# Patient Record
Sex: Female | Born: 1995 | Race: Asian | Hispanic: No | Marital: Married | Smoking: Never smoker
Health system: Southern US, Community
[De-identification: ages and names within clinical notes are randomized; demographics above are authoritative.]

## PROBLEM LIST (undated history)

## (undated) DIAGNOSIS — E282 Polycystic ovarian syndrome: Secondary | ICD-10-CM

## (undated) DIAGNOSIS — Z789 Other specified health status: Secondary | ICD-10-CM

## (undated) HISTORY — PX: NO PAST SURGERIES: SHX2092

## (undated) HISTORY — DX: Other specified health status: Z78.9

---

## 2019-04-29 ENCOUNTER — Ambulatory Visit (INDEPENDENT_AMBULATORY_CARE_PROVIDER_SITE_OTHER): Payer: Self-pay

## 2019-04-29 ENCOUNTER — Ambulatory Visit (HOSPITAL_COMMUNITY)
Admission: EM | Admit: 2019-04-29 | Discharge: 2019-04-29 | Disposition: A | Discharge status: Home / Self Care | Payer: Self-pay

## 2019-04-29 ENCOUNTER — Other Ambulatory Visit: Payer: Self-pay

## 2019-04-29 ENCOUNTER — Encounter (HOSPITAL_COMMUNITY): Payer: Self-pay | Admitting: Emergency Medicine

## 2019-04-29 DIAGNOSIS — R1084 Generalized abdominal pain: Secondary | ICD-10-CM

## 2019-04-29 DIAGNOSIS — Z3202 Encounter for pregnancy test, result negative: Secondary | ICD-10-CM

## 2019-04-29 DIAGNOSIS — K5900 Constipation, unspecified: Secondary | ICD-10-CM

## 2019-04-29 HISTORY — DX: Polycystic ovarian syndrome: E28.2

## 2019-04-29 LAB — POCT URINALYSIS DIP (DEVICE)
Bilirubin Urine: NEGATIVE
Glucose, UA: NEGATIVE mg/dL
Hgb urine dipstick: NEGATIVE
Ketones, ur: NEGATIVE mg/dL
Leukocytes,Ua: NEGATIVE
Nitrite: NEGATIVE
Protein, ur: NEGATIVE mg/dL
Specific Gravity, Urine: <=1.005 (ref 1.005–1.030)
Urobilinogen, UA: 0.2 mg/dL (ref 0.0–1.0)
pH: 6.5 (ref 5.0–8.0)

## 2019-04-29 LAB — POCT PREGNANCY, URINE: Preg Test, Ur: NEGATIVE

## 2019-04-29 MED ORDER — MAGNESIUM CITRATE PO SOLN: 1.0000 | Freq: Once | ORAL | 0 refills | Status: AC

## 2019-04-29 MED ORDER — DOCUSATE SODIUM 100 MG PO CAPS: 100.0000 mg | ORAL_CAPSULE | Freq: Two times a day (BID) | ORAL | 0 refills | Status: AC

## 2019-04-29 MED ORDER — POLYETHYLENE GLYCOL 3350 17 G PO PACK: 17.0000 g | PACK | Freq: Every day | ORAL | 0 refills | Status: DC

## 2019-04-29 NOTE — ED Notes (Signed)
Instructed to put on a gown for provider exam

## 2019-04-29 NOTE — ED Notes (Signed)
Patient in bathroom in lobby 

## 2019-04-29 NOTE — ED Provider Notes (Signed)
[facility]    CSN: [id number] Arrival date & time: 04/29/19  1004      History   Chief Complaint Chief Complaint  Patient presents with  . Abdominal Pain    HPI Anne Becker is a 23 y.o. female.   Patient presents with generalized abdominal pain x2 days.  She reports no bowel movement x4 days and feels constipated.  She has attempted treatment with stool softeners without relief.  She also reports dysuria and a small amount of white vaginal discharge.  She denies fever, chills, vomiting, diarrhea, back pain, pelvic pain, or other symptoms.  LMP: July 2020, patient states her cycles are irregular due to PCOS.  She is sexually active with 1 partner x3 months without use of condoms.  The history is provided by the patient.    Past Medical History:  Diagnosis Date  . PCOS (polycystic ovarian syndrome)     There are no active problems to display for this patient.   History reviewed. No pertinent surgical history.  OB History   No obstetric history on file.      Home Medications    Prior to Admission medications   Medication Sig Start Date End Date Taking? Authorizing Provider  ferrous sulfate 325 (65 FE) MG tablet Take 325 mg by mouth daily with breakfast.   Yes [provider]  METFORMIN HCL PO Take by mouth.   Yes [provider]  NON FORMULARY supplements   Yes [provider]  docusate sodium (COLACE) 100 MG capsule Take 1 capsule (100 mg total) by mouth every 12 (twelve) hours. 04/30/19   Mickie Bailate, Kynlee Koenigsberg H, NP  magnesium citrate SOLN Take 296 mLs (1 Bottle total) by mouth once for 1 dose. 04/29/19 04/29/19  Mickie Bailate, Shilpa Bushee H, NP  polyethylene glycol (MIRALAX / GLYCOLAX) 17 g packet Take 17 g by mouth daily. 04/30/19   Mickie Bailate, Zenora Karpel H, NP    Family History Family History  Problem Relation Age of Onset  . Diabetes Mother   . Diabetes Father     Social History Social History   Tobacco Use  . Smoking status: Never Smoker  Substance Use  Topics  . Alcohol use: Not Currently  . Drug use: Never     Allergies   Patient has no known allergies.   Review of Systems Review of Systems  Constitutional: Negative for chills and fever.  HENT: Negative for ear pain and sore throat.   Eyes: Negative for pain and visual disturbance.  Respiratory: Negative for cough and shortness of breath.   Cardiovascular: Negative for chest pain and palpitations.  Gastrointestinal: Positive for abdominal distention, abdominal pain and constipation. Negative for vomiting.  Genitourinary: Positive for dysuria and vaginal discharge. Negative for hematuria.  Musculoskeletal: Negative for arthralgias and back pain.  Skin: Negative for color change and rash.  Neurological: Negative for seizures and syncope.  All other systems reviewed and are negative.    Physical Exam Triage Vital Signs ED Triage Vitals  Enc Vitals Group     BP      Pulse      Resp      Temp      Temp src      SpO2      Weight      Height      Head Circumference      Peak Flow      Pain Score      Pain Loc      Pain Edu?  Excl. in [place]?    No data found.  Updated Vital Signs BP 127/86   Pulse (!) 108   Temp 98.6 F (37 C) (Oral)   Resp 16   LMP 01/10/2019   SpO2 100%   Visual Acuity Right Eye Distance:   Left Eye Distance:   Bilateral Distance:    Right Eye Near:   Left Eye Near:    Bilateral Near:     Physical Exam Vitals signs and nursing note reviewed.  Constitutional:      General: She is not in acute distress.    Appearance: She is well-developed.  HENT:     Head: Normocephalic and atraumatic.     Mouth/Throat:     Mouth: Mucous membranes are moist.     Pharynx: Oropharynx is clear.  Eyes:     Conjunctiva/sclera: Conjunctivae normal.  Neck:     Musculoskeletal: Neck supple.  Cardiovascular:     Rate and Rhythm: Normal rate and regular rhythm.     Heart sounds: No murmur.  Pulmonary:     Effort: Pulmonary effort is normal. No  respiratory distress.     Breath sounds: Normal breath sounds.  Abdominal:     General: Bowel sounds are normal. There is distension.     Palpations: Abdomen is soft.     Tenderness: There is abdominal tenderness. There is no right CVA tenderness, left CVA tenderness, guarding or rebound.     Comments: Mild generalized tenderness to palpation.   Skin:    General: Skin is warm and dry.  Neurological:     Mental Status: She is alert.      UC Treatments / Results  Labs (all labs ordered are listed, but only abnormal results are displayed) Labs Reviewed  POCT URINALYSIS DIP (DEVICE)  POCT PREGNANCY, URINE  CERVICOVAGINAL ANCILLARY ONLY    EKG   Radiology Dg Abd 1 View  Result Date: 04/29/2019 CLINICAL DATA:  Abdominal pain and constipation EXAM: ABDOMEN - 1 VIEW COMPARISON:  None. FINDINGS: There is diffuse stool throughout much of the colon. Upper rectum is mildly distended by stool. No bowel dilatation or air-fluid level evident to suggest bowel obstruction. No free air. No abnormal calcifications. IMPRESSION: Findings indicative of a degree of constipation. Note that the upper rectum is mildly distended by stool. No bowel obstruction or free air evident. Electronically Signed   By: Lowella Grip III M.D.   On: 04/29/2019 11:41    Procedures Procedures (including critical care time)  Medications Ordered in UC Medications - No data to display  Initial Impression / Assessment and Plan / UC Course  I have reviewed the triage vital signs and the nursing notes.  Pertinent labs & imaging results that were available during my care of the patient were reviewed by me and considered in my medical decision making (see chart for details).   Generalized abdominal pain, constipation.  Urine pregnancy negative; urine dip negative.  KUB shows constipation but no obstruction or free air.  Patient obtained a self vaginal swab and STD tests pending.  Treating with magnesium citrate today  and then starting MiraLAX and Colace tomorrow.  Strict instructions discussed with patient at length to go to the emergency department if she has acute worsening symptoms including abdominal pain or if she develops new symptoms such as fever or vomiting.  Instructed patient to follow-up with her PCP as needed.  Patient agrees to plan of care.       Final Clinical Impressions(s) / UC  Diagnoses   Final diagnoses:  Generalized abdominal pain  Constipation, unspecified constipation type     Discharge Instructions     Take the magnesium citrate today as directed.    Start taking the polyethylene glycol and docusate as directed tomorrow.    Go to the emergency department if you have acute worsening symptoms, including worsening abdominal pain; or if you develop new symptoms such as fever or vomiting.    Follow-up with your primary care provider or the provider listed below as needed.          ED Prescriptions    Medication Sig Dispense Auth. Provider   magnesium citrate SOLN Take 296 mLs (1 Bottle total) by mouth once for 1 dose. 195 mL Wendee Beavers H, NP   polyethylene glycol (MIRALAX / GLYCOLAX) 17 g packet Take 17 g by mouth daily. 14 each Mickie Bail, NP   docusate sodium (COLACE) 100 MG capsule Take 1 capsule (100 mg total) by mouth every 12 (twelve) hours. 60 capsule Mickie Bail, NP     PDMP not reviewed this encounter.   Mickie Bail, NP 04/29/19 [phone]

## 2019-04-29 NOTE — ED Triage Notes (Signed)
abdominal pain for 2 days.  Patient has taken stool softner with no relief.  Patient reports stool is too hard.  Denies history of constipation.  Last bm was 4 days ago

## 2019-04-29 NOTE — Discharge Instructions (Signed)
Take the magnesium citrate today as directed.    Start taking the polyethylene glycol and docusate as directed tomorrow.    Go to the emergency department if you have acute worsening symptoms, including worsening abdominal pain; or if you develop new symptoms such as fever or vomiting.    Follow-up with your primary care provider or the provider listed below as needed.

## 2019-04-30 ENCOUNTER — Ambulatory Visit: Payer: Self-pay | Admitting: Nurse Practitioner

## 2019-05-02 ENCOUNTER — Telehealth (HOSPITAL_COMMUNITY): Payer: Self-pay | Admitting: Emergency Medicine

## 2019-05-02 LAB — CERVICOVAGINAL ANCILLARY ONLY
Bacterial vaginitis: NEGATIVE
Candida vaginitis: POSITIVE — AB
Chlamydia: NEGATIVE
Neisseria Gonorrhea: NEGATIVE
Trichomonas: NEGATIVE

## 2019-05-02 MED ORDER — FLUCONAZOLE 150 MG PO TABS: 150.0000 mg | ORAL_TABLET | Freq: Once | ORAL | 0 refills | Status: AC

## 2019-05-02 NOTE — Telephone Encounter (Signed)
Test for candida (yeast) was positive.  Prescription for fluconazole 150mg po now, repeat dose in 3d if needed, #2 no refills, sent to the pharmacy of record.  Recheck or followup with PCP for further evaluation if symptoms are not improving.    Patient contacted and made aware of    results, all questions answered   

## 2019-05-27 ENCOUNTER — Encounter: Payer: Self-pay | Admitting: Advanced Practice Midwife

## 2019-05-27 ENCOUNTER — Ambulatory Visit: Payer: Self-pay | Admitting: Clinical

## 2019-05-27 ENCOUNTER — Ambulatory Visit (INDEPENDENT_AMBULATORY_CARE_PROVIDER_SITE_OTHER): Payer: Self-pay | Admitting: Advanced Practice Midwife

## 2019-05-27 ENCOUNTER — Other Ambulatory Visit: Payer: Self-pay

## 2019-05-27 VITALS — BP 125/81 | HR 98 | Wt 118.4 lb

## 2019-05-27 DIAGNOSIS — Z319 Encounter for procreative management, unspecified: Secondary | ICD-10-CM

## 2019-05-27 DIAGNOSIS — Z0000 Encounter for general adult medical examination without abnormal findings: Secondary | ICD-10-CM

## 2019-05-27 DIAGNOSIS — R4589 Other symptoms and signs involving emotional state: Secondary | ICD-10-CM

## 2019-05-27 DIAGNOSIS — Z598 Other problems related to housing and economic circumstances: Secondary | ICD-10-CM

## 2019-05-27 DIAGNOSIS — Z5989 Other problems related to housing and economic circumstances: Secondary | ICD-10-CM

## 2019-05-27 DIAGNOSIS — Z658 Other specified problems related to psychosocial circumstances: Secondary | ICD-10-CM

## 2019-05-27 DIAGNOSIS — E282 Polycystic ovarian syndrome: Secondary | ICD-10-CM

## 2019-05-27 NOTE — BH Specialist Note (Signed)
Integrated Behavioral Health Initial Visit  MRN: [id number] Name: Joliene Salvador  Number of [facility] Clinician visits:: 1/6 Session Start time: 4:52  Session End time: 5:07 Total time: 15  Type of Service: [place] Interpretor:Yes.   Interpretor Name and Language: Bengali   Warm Hand Off Completed.       SUBJECTIVE: Arbie Reisz is a 23 y.o. female accompanied by n/a Patient was referred by Mallie Snooks, CNM  for positive depression screen. Patient reports the following symptoms/concerns: Pt states she feels sad and hopeless, does not think she will be able to have a baby with PCOS and being uninsured.  Duration of problem: Undetermined; Severity of problem: severe  OBJECTIVE: Mood: Anxious and Affect: Appropriate Risk of harm to self or others: No plan to harm self or others  LIFE CONTEXT: Family and Social: - School/Work: - Self-Care: - Life Changes: Recent PCOS diagnosis  GOALS ADDRESSED: Patient will: 1. Reduce symptoms of: stress 2. Increase knowledge and/or ability of: healthy habits and stress reduction  3. Demonstrate ability to: Increase healthy adjustment to current life circumstances  INTERVENTIONS: Interventions utilized: Supportive Counseling  Standardized Assessments completed: GAD-7 and PHQ 9  ASSESSMENT: Patient currently experiencing Psychosocial stress.   Patient may benefit from brief supportive counseling today and financial application.  PLAN: 1. Follow up with behavioral health clinician on : As needed 2. Behavioral recommendations:  -Take financial paperwork home today; husband will help complete and send in to address provided - 3. Referral(s): [facility]) 4. "From scale of 1-10, how likely are you to follow plan?": -  Garlan Fair, LCSW

## 2019-05-27 NOTE — Patient Instructions (Signed)
Polycystic Ovarian Syndrome  Polycystic ovarian syndrome (PCOS) is a common hormonal disorder among women of reproductive age. In most women with PCOS, many small fluid-filled sacs (cysts) grow on the ovaries, and the cysts are not part of a normal menstrual cycle. PCOS can cause problems with your menstrual periods and make it difficult to get pregnant. It can also cause an increased risk of miscarriage with pregnancy. If it is not treated, PCOS can lead to serious health problems, such as diabetes and heart disease. What are the causes? The cause of PCOS is not known, but it may be the result of a combination of certain factors, such as:  Irregular menstrual cycle.  High levels of certain hormones (androgens).  Problems with the hormone that helps to control blood sugar (insulin resistance).  Certain genes. What increases the risk? This condition is more likely to develop in women who have a family history of PCOS. What are the signs or symptoms? Symptoms of PCOS may include:  Multiple ovarian cysts.  Infrequent periods or no periods.  Periods that are too frequent or too heavy.  Unpredictable periods.  Inability to get pregnant (infertility) because of not ovulating.  Increased growth of hair on the face, chest, stomach, back, thumbs, thighs, or toes.  Acne or oily skin. Acne may develop during adulthood, and it may not respond to treatment.  Pelvic pain.  Weight gain or obesity.  Patches of thickened and dark brown or black skin on the neck, arms, breasts, or thighs (acanthosis nigricans).  Excess hair growth on the face, chest, abdomen, or upper thighs (hirsutism). How is this diagnosed? This condition is diagnosed based on:  Your medical history.  A physical exam, including a pelvic exam. Your health care provider may look for areas of increased hair growth on your skin.  Tests, such as: ? Ultrasound. This may be used to examine the ovaries and the lining of the  uterus (endometrium) for cysts. ? Blood tests. These may be used to check levels of sugar (glucose), female hormone (testosterone), and female hormones (estrogen and progesterone) in your blood. How is this treated? There is no cure for PCOS, but treatment can help to manage symptoms and prevent more health problems from developing. Treatment varies depending on:  Your symptoms.  Whether you want to have a baby or whether you need birth control (contraception). Treatment may include nutrition and lifestyle changes along with:  Progesterone hormone to start a menstrual period.  Birth control pills to help you have regular menstrual periods.  Medicines to make you ovulate, if you want to get pregnant.  Medicine to reduce excessive hair growth.  Surgery, in severe cases. This may involve making small holes in one or both of your ovaries. This decreases the amount of testosterone that your body produces. Follow these instructions at home:  Take over-the-counter and prescription medicines only as told by your health care provider.  Follow a healthy meal plan. This can help you reduce the effects of PCOS. ? Eat a healthy diet that includes lean proteins, complex carbohydrates, fresh fruits and vegetables, low-fat dairy products, and healthy fats. Make sure to eat enough fiber.  If you are overweight, lose weight as told by your health care provider. ? Losing 10% of your body weight may improve symptoms. ? Your health care provider can determine how much weight loss is best for you and can help you lose weight safely.  Keep all follow-up visits as told by your health care provider.   This is important. Contact a health care provider if:  Your symptoms do not get better with medicine.  You develop new symptoms. This information is not intended to replace advice given to you by your health care provider. Make sure you discuss any questions you have with your health care provider. Document  Released: 10/06/2004 Document Revised: 05/25/2017 Document Reviewed: 11/28/2015 Elsevier Patient Education  2020 Elsevier Inc.  

## 2019-05-27 NOTE — Progress Notes (Signed)
GYNECOLOGY ANNUAL PREVENTATIVE CARE ENCOUNTER NOTE  History:     Anne Becker is a 23 y.o. G0P0000 female here for a routine annual gynecologic exam.  Current complaints: irregular cycles in setting of previously diagnosed PCOS.   Patient estimates she has a menstrual cycle about every 6 months. She and her husband desire pregnancy. An outside provider started her on Metformin about eight months ago but she only started living with her husband three months ago. She estimates they had intercourse 3-4 times per week for the first two months of their cohabitation but have not had sex in four weeks due to a recurrent yeast infection.  Patient was seen in the ED for abdominal pain on 11/03. She was advised to start Miralax and Colace for constipation and continues to do so. She endorses a bowel movement every other day.   She denies discharge, pelvic pain, problems with intercourse or other gynecologic concerns.    Gynecologic History Patient's last menstrual period was 05/16/2019 (exact date). Contraception: none, desires pregnancy Last Pap: remote. Results were normal per patient  Obstetric History OB History  Gravida Para Term Preterm AB Living  0 0 0 0 0 0  SAB TAB Ectopic Multiple Live Births  0 0 0 0 0    Past Medical History:  Diagnosis Date  . PCOS (polycystic ovarian syndrome)     No past surgical history on file.  Current Outpatient Medications on File Prior to Visit  Medication Sig Dispense Refill  . docusate sodium (COLACE) 100 MG capsule Take 1 capsule (100 mg total) by mouth every 12 (twelve) hours. 60 capsule 0  . ferrous sulfate 325 (65 FE) MG tablet Take 325 mg by mouth daily with breakfast.    . METFORMIN HCL PO Take by mouth.    . NON FORMULARY supplements    . polyethylene glycol (MIRALAX / GLYCOLAX) 17 g packet Take 17 g by mouth daily. 14 each 0  . RIBOFLAVIN PO Take by mouth.     No current facility-administered medications on file prior to visit.      No Known Allergies  Social History:  reports that she has never smoked. She has never used smokeless tobacco. She reports previous alcohol use. She reports that she does not use drugs.  Family History  Problem Relation Age of Onset  . Diabetes Mother   . Diabetes Father     The following portions of the patient's history were reviewed and updated as appropriate: allergies, current medications, past family history, past medical history, past social history, past surgical history and problem list.  Review of Systems Pertinent items noted in HPI and remainder of comprehensive ROS otherwise negative.  Physical Exam:  BP 125/81   Pulse 98   Wt 118 lb 6.4 oz (53.7 kg)   LMP 05/16/2019 (Exact Date)  CONSTITUTIONAL: Well-developed, well-nourished female in no acute distress.  HENT:  Normocephalic, atraumatic, External right and left ear normal. Oropharynx is clear and moist EYES: Conjunctivae and EOM are normal. Pupils are equal, round, and reactive to light. No scleral icterus.  NECK: Normal range of motion, supple, no masses.  Normal thyroid.  SKIN: Skin is warm and dry. No rash noted. Not diaphoretic. No erythema. No pallor. MUSCULOSKELETAL: Normal range of motion. No tenderness.  No cyanosis, clubbing, or edema.  2+ distal pulses. NEUROLOGIC: Alert and oriented to person, place, and time. Normal reflexes, muscle tone coordination.  PSYCHIATRIC: Normal mood and affect. Normal behavior. Normal judgment and thought content.  CARDIOVASCULAR: Normal heart rate noted, regular rhythm RESPIRATORY: Clear to auscultation bilaterally. Effort and breath sounds normal, no problems with respiration noted. BREASTS: Symmetric in size. No masses, tenderness, skin changes, nipple drainage, or lymphadenopathy bilaterally. ABDOMEN: Soft, no distention noted.  No tenderness, rebound or guarding.     Assessment and Plan:    1. Well woman exam (no gynecological exam) - NO concerning findings on physical  exam - CBC - Hemoglobin A1c  2. Patient desires pregnancy - Discussed with patient that there is a wide range of options available to her depending on how quickly she desires pregnancy, her husband's presence in the home, her willingness to track symptoms and her financial constraints.  - Given self-pay status and high level of concern about infertility, labs ordered per patient request - Given phone number to inquire about free community pap smears - CBC - FSH - Testosterone,Free and Total - TSH - Prolactin  3. PCOS (polycystic ovarian syndrome) - [place]  4. Thoughts of self harm - Just before leaving, patient verbalized thoughts of self-harm without plan - Met with Vesta Mixer prior to leaving clinic - Understands she can call clinic or present to ED for recurrent thoughts PRN  Will follow-up with patient when labs result to discuss timeline for future appointments  Routine preventative health maintenance measures emphasized. Please refer to After Visit Summary for other counseling recommendations.     Language barrier: Bengali interpreter [place] present for all interaction  Total visit time: 75 minutes. Greater than 50% of visit spent in counseling and coordination of care.  Mallie Snooks, MSN, CNM Certified Nurse Midwife, [facility] for [employer], [facility] 05/27/19 8:19 PM

## 2019-05-28 ENCOUNTER — Telehealth: Payer: Self-pay | Admitting: Advanced Practice Midwife

## 2019-05-28 LAB — CBC
Hematocrit: 42.8 % (ref 34.0–46.6)
Hemoglobin: 13.8 g/dL (ref 11.1–15.9)
MCH: 27.6 pg (ref 26.6–33.0)
MCHC: 32.2 g/dL (ref 31.5–35.7)
MCV: 86 fL (ref 79–97)
Platelets: 287 10*3/uL (ref 150–450)
RBC: 5 x10E6/uL (ref 3.77–5.28)
RDW: 12.7 % (ref 11.7–15.4)
WBC: 6.6 10*3/uL (ref 3.4–10.8)

## 2019-05-28 LAB — HEMOGLOBIN A1C
Est. average glucose Bld gHb Est-mCnc: 108 mg/dL
Hgb A1c MFr Bld: 5.4 % (ref 4.8–5.6)

## 2019-05-28 LAB — TESTOSTERONE,FREE AND TOTAL
Testosterone, Free: 1.6 pg/mL (ref 0.0–4.2)
Testosterone: 31 ng/dL (ref 8–48)

## 2019-05-28 LAB — FOLLICLE STIMULATING HORMONE: FSH: 8.3 m[IU]/mL

## 2019-05-28 LAB — TSH: TSH: 1.98 u[IU]/mL (ref 0.450–4.500)

## 2019-05-28 LAB — PROLACTIN: Prolactin: 19.1 ng/mL (ref 4.8–23.3)

## 2019-05-28 NOTE — Telephone Encounter (Signed)
VLTCB regarding lab results from clinic appointment yesterday. Utilized [place] interpreter via [employer] for call.  Mallie Snooks, MSN, CNM Certified Nurse Midwife, [facility] for [employer], [facility] 05/28/19 12:47 PM

## 2019-09-11 ENCOUNTER — Ambulatory Visit: Payer: Self-pay | Attending: Family

## 2019-09-11 DIAGNOSIS — Z23 Encounter for immunization: Secondary | ICD-10-CM

## 2019-09-11 NOTE — Progress Notes (Signed)
   Covid-19 Vaccination Clinic  Name:  Adellyn Capek    MRN: [id number] DOB: 10/19/1995  09/11/2019  Ms. Grumbine was observed post Covid-19 immunization for 15 minutes without incident. She was provided with Vaccine Information Sheet and instruction to access the V-Safe system.   Ms. Granquist was instructed to call 911 with any severe reactions post vaccine: Marland Kitchen Difficulty breathing  . Swelling of face and throat  . A fast heartbeat  . A bad rash all over body  . Dizziness and weakness   Immunizations Administered    Name Date Dose VIS Date Route   Moderna COVID-19 Vaccine 09/11/2019 12:29 PM 0.5 mL 05/27/2019 Intramuscular   Manufacturer: Moderna   Lot: [id number]   NDC: 76546-503-54

## 2019-10-14 ENCOUNTER — Ambulatory Visit: Payer: Self-pay

## 2019-10-14 ENCOUNTER — Ambulatory Visit: Payer: Self-pay | Attending: Family

## 2019-10-14 DIAGNOSIS — Z23 Encounter for immunization: Secondary | ICD-10-CM

## 2019-10-14 NOTE — Progress Notes (Signed)
   Covid-19 Vaccination Clinic  Name:  Anne Becker    MRN: [id number] DOB: [birth year]  10/14/2019  [name] was observed post Covid-19 immunization for 15 minutes without incident. She was provided with Vaccine Information Sheet and instruction to access the V-Safe system.   [name] was instructed to call 911 with any severe reactions post vaccine: Marland Kitchen Difficulty breathing  . Swelling of face and throat  . A fast heartbeat  . A bad rash all over body  . Dizziness and weakness   Immunizations Administered    Name Date Dose VIS Date Route   Moderna COVID-19 Vaccine 10/14/2019 11:29 AM 0.5 mL 05/2019 Intramuscular   Manufacturer: Moderna   Lot: [id number]   NDC: 03559-741-63

## 2020-02-25 ENCOUNTER — Encounter: Payer: Self-pay | Admitting: Family Medicine

## 2020-02-25 ENCOUNTER — Other Ambulatory Visit: Payer: Self-pay

## 2020-02-25 ENCOUNTER — Ambulatory Visit: Payer: Self-pay | Attending: Family Medicine | Admitting: Family Medicine

## 2020-02-25 VITALS — BP 104/66 | HR 91 | Ht 63.0 in | Wt 125.0 lb

## 2020-02-25 DIAGNOSIS — N970 Female infertility associated with anovulation: Secondary | ICD-10-CM

## 2020-02-25 DIAGNOSIS — E282 Polycystic ovarian syndrome: Secondary | ICD-10-CM

## 2020-02-25 DIAGNOSIS — R4589 Other symptoms and signs involving emotional state: Secondary | ICD-10-CM

## 2020-02-25 MED ORDER — METFORMIN HCL 500 MG PO TABS: 500.0000 mg | ORAL_TABLET | Freq: Two times a day (BID) | ORAL | 3 refills | Status: DC

## 2020-02-25 NOTE — Patient Instructions (Signed)
Polycystic Ovarian Syndrome  Polycystic ovarian syndrome (PCOS) is a common hormonal disorder among women of reproductive age. In most women with PCOS, many small fluid-filled sacs (cysts) grow on the ovaries, and the cysts are not part of a normal menstrual cycle. PCOS can cause problems with your menstrual periods and make it difficult to get pregnant. It can also cause an increased risk of miscarriage with pregnancy. If it is not treated, PCOS can lead to serious health problems, such as diabetes and heart disease. What are the causes? The cause of PCOS is not known, but it may be the result of a combination of certain factors, such as:  Irregular menstrual cycle.  High levels of certain hormones (androgens).  Problems with the hormone that helps to control blood sugar (insulin resistance).  Certain genes. What increases the risk? This condition is more likely to develop in women who have a family history of PCOS. What are the signs or symptoms? Symptoms of PCOS may include:  Multiple ovarian cysts.  Infrequent periods or no periods.  Periods that are too frequent or too heavy.  Unpredictable periods.  Inability to get pregnant (infertility) because of not ovulating.  Increased growth of hair on the face, chest, stomach, back, thumbs, thighs, or toes.  Acne or oily skin. Acne may develop during adulthood, and it may not respond to treatment.  Pelvic pain.  Weight gain or obesity.  Patches of thickened and dark brown or black skin on the neck, arms, breasts, or thighs (acanthosis nigricans).  Excess hair growth on the face, chest, abdomen, or upper thighs (hirsutism). How is this diagnosed? This condition is diagnosed based on:  Your medical history.  A physical exam, including a pelvic exam. Your health care provider may look for areas of increased hair growth on your skin.  Tests, such as: ? Ultrasound. This may be used to examine the ovaries and the lining of the  uterus (endometrium) for cysts. ? Blood tests. These may be used to check levels of sugar (glucose), female hormone (testosterone), and female hormones (estrogen and progesterone) in your blood. How is this treated? There is no cure for PCOS, but treatment can help to manage symptoms and prevent more health problems from developing. Treatment varies depending on:  Your symptoms.  Whether you want to have a baby or whether you need birth control (contraception). Treatment may include nutrition and lifestyle changes along with:  Progesterone hormone to start a menstrual period.  Birth control pills to help you have regular menstrual periods.  Medicines to make you ovulate, if you want to get pregnant.  Medicine to reduce excessive hair growth.  Surgery, in severe cases. This may involve making small holes in one or both of your ovaries. This decreases the amount of testosterone that your body produces. Follow these instructions at home:  Take over-the-counter and prescription medicines only as told by your health care provider.  Follow a healthy meal plan. This can help you reduce the effects of PCOS. ? Eat a healthy diet that includes lean proteins, complex carbohydrates, fresh fruits and vegetables, low-fat dairy products, and healthy fats. Make sure to eat enough fiber.  If you are overweight, lose weight as told by your health care provider. ? Losing 10% of your body weight may improve symptoms. ? Your health care provider can determine how much weight loss is best for you and can help you lose weight safely.  Keep all follow-up visits as told by your health care provider.   This is important. Contact a health care provider if:  Your symptoms do not get better with medicine.  You develop new symptoms. This information is not intended to replace advice given to you by your health care provider. Make sure you discuss any questions you have with your health care provider. Document  Revised: 05/25/2017 Document Reviewed: 11/28/2015 Elsevier Patient Education  2020 Elsevier Inc.  

## 2020-02-25 NOTE — Progress Notes (Addendum)
Name: Anne Becker   MRN: [id number]    DOB: Sep 02, 1995   Date:02/25/2020       Progress Note  Subjective  Chief Complaint  Patient presents with  . New Patient (Initial Visit)    HPI 24 year old female being seen to discuss infertility concern. She has a past medical history of PCOS (diagnosed when she lived in [place]). She is currently taking metformin 500mg  twice a day. She also takes folic acid, multivitamin, and riboflavin daily. States her and her husband have been trying for 1 year to get pregnant. She has been tracking her ovulation and the kits never state she is ovulating. Reports having intercourse 2-3 times weekly. Reports having irregular menstrual cycles, sometimes stating she sometimes does not have a cycle for 3 months. Reports seeing a nurse midwife in Dec 2020 and they checked her hormone levels, which were are normal.   Reports feeling depressed at times due to her infertility. Denies any self harm thoughts or behaviors. Does not want to discuss depressed mood at this time. Want to solve infertility issues as it is causing her depressed mood.  Declines influenza vaccine.  HPI completed with interpreter, Sanjoy.   Patient Active Problem List   Diagnosis Date Noted  . PCOS (polycystic ovarian syndrome)     Past Medical History:  Diagnosis Date  . PCOS (polycystic ovarian syndrome)     History reviewed. No pertinent surgical history.  Social History   Tobacco Use  . Smoking status: Never Smoker  . Smokeless tobacco: Never Used  Substance Use Topics  . Alcohol use: Not Currently     Current Outpatient Medications:  .  RIBOFLAVIN PO, Take by mouth., Disp: , Rfl:  .  docusate sodium (COLACE) 100 MG capsule, Take 1 capsule (100 mg total) by mouth every 12 (twelve) hours. (Patient not taking: Reported on 02/25/2020), Disp: 60 capsule, Rfl: 0 .  ferrous sulfate 325 (65 FE) MG tablet, Take 325 mg by mouth daily with breakfast. (Patient not taking: Reported on  02/25/2020), Disp: , Rfl:  .  metFORMIN (GLUCOPHAGE) 500 MG tablet, Take 1 tablet (500 mg total) by mouth 2 (two) times daily with a meal., Disp: 60 tablet, Rfl: 3 .  NON FORMULARY, supplements (Patient not taking: Reported on 02/25/2020), Disp: , Rfl:  .  polyethylene glycol (MIRALAX / GLYCOLAX) 17 g packet, Take 17 g by mouth daily. (Patient not taking: Reported on 02/25/2020), Disp: 14 each, Rfl: 0  No Known Allergies  Review of Systems  Constitutional: Negative for chills, fever and weight loss.  Respiratory: Negative for cough and shortness of breath.   Cardiovascular: Negative for chest pain.  Gastrointestinal: Negative for abdominal pain, constipation, nausea and vomiting.  Genitourinary: Negative for dysuria, flank pain and urgency.       GYN: Denies pelvic pain, dyspareunia, or vaginal discharge.  Musculoskeletal: Negative for myalgias.  Neurological: Negative for dizziness and headaches.  Psychiatric/Behavioral: Positive for depression. Negative for suicidal ideas.     No other specific complaints in a complete review of systems (except as listed in HPI above).  Objective  Vitals:   02/25/20 0856  BP: 104/66  Pulse: 91  SpO2: 99%  Weight: 125 lb (56.7 kg)  Height: 5\' 3"  (1.6 m)    Body mass index is 22.14 kg/m.  Nursing Note and Vital Signs reviewed.  Physical Exam Constitutional:      Appearance: Normal appearance.  HENT:     Head: Normocephalic and atraumatic.  Cardiovascular:  Rate and Rhythm: Normal rate and regular rhythm.     Heart sounds: S1 normal and S2 normal.  Pulmonary:     Effort: Pulmonary effort is normal.     Breath sounds: Normal breath sounds. No wheezing or rhonchi.  Abdominal:     General: Abdomen is flat.     Tenderness: There is no abdominal tenderness.  Genitourinary:    Comments: Patient declined  Skin:    General: Skin is warm.     Comments: No facial acne or hair  Neurological:     Mental Status: She is alert and oriented to  person, place, and time.  Psychiatric:        Attention and Perception: Attention and perception normal.        Mood and Affect: Mood normal.      Assessment & Plan  1. Infertility associated with anovulation Will refer patient to fertility specialist. Patient declines Pap smear. - US Pelvic Complete With Transvaginal; Future  2. PCOS (polycystic ovarian syndrome) Continue taking metformin. Will check a transvaginal ultrasound to confirm diagnosis of PCOS.  - US Pelvic Complete With Transvaginal; Future - metFORMIN (GLUCOPHAGE) 500 MG tablet; Take 1 tablet (500 mg total) by mouth 2 (two) times daily with a meal.  Dispense: 60 tablet; Refill: 3  3. Depressed mood Patient declines any services related to depressed mood. Would like to fix fertility issues as it is causing her depressed mood.   Return in about 3 months (around 05/26/2020) for medical conditions.   Evaluation and management procedures were performed by me with NP Student in attendance, note written by NP student under my supervision and collaboration. I have reviewed the note and I agree with the management and plan.  In summary patient with PCOS, infertility and unfortunately has no medical coverage which would be a hindrance to seeing an infertility specialist.  I have advised her to apply for the [place] financial discount to facilitate referral, she will continue Metformin and a pelvic ultrasound has been ordered.  Hoy Register, MD, FAAFP. [facility] [place], [place] [phone]   02/25/2020, 12:15 PM

## 2020-02-25 NOTE — Progress Notes (Signed)
Patient has PCOS

## 2020-02-27 ENCOUNTER — Other Ambulatory Visit: Payer: Self-pay

## 2020-02-27 ENCOUNTER — Ambulatory Visit (HOSPITAL_COMMUNITY)
Admission: RE | Admit: 2020-02-27 | Discharge: 2020-02-27 | Disposition: A | Discharge status: Home / Self Care | Payer: Self-pay | Source: Ambulatory Visit | Attending: Family Medicine | Admitting: Family Medicine

## 2020-02-27 DIAGNOSIS — N970 Female infertility associated with anovulation: Secondary | ICD-10-CM | POA: Insufficient documentation

## 2020-02-27 DIAGNOSIS — E282 Polycystic ovarian syndrome: Secondary | ICD-10-CM | POA: Insufficient documentation

## 2020-03-02 ENCOUNTER — Other Ambulatory Visit: Payer: Self-pay | Admitting: Family Medicine

## 2020-03-02 DIAGNOSIS — E282 Polycystic ovarian syndrome: Secondary | ICD-10-CM

## 2020-03-02 DIAGNOSIS — N970 Female infertility associated with anovulation: Secondary | ICD-10-CM

## 2020-03-15 ENCOUNTER — Telehealth: Payer: Self-pay | Admitting: Family Medicine

## 2020-03-15 ENCOUNTER — Encounter: Payer: Self-pay | Admitting: Family Medicine

## 2020-03-15 NOTE — Telephone Encounter (Signed)
Patient was sent a mychart message in regards to her ultrasound.

## 2020-03-15 NOTE — Telephone Encounter (Signed)
Patient would like to speak with PCP regarding her ultrasound results. Please f/u

## 2020-03-22 ENCOUNTER — Other Ambulatory Visit: Payer: Self-pay

## 2020-03-22 ENCOUNTER — Ambulatory Visit: Payer: Self-pay

## 2020-05-26 ENCOUNTER — Ambulatory Visit: Payer: Self-pay | Admitting: Family Medicine

## 2020-06-07 ENCOUNTER — Ambulatory Visit: Payer: Self-pay | Attending: Internal Medicine

## 2020-06-07 DIAGNOSIS — Z23 Encounter for immunization: Secondary | ICD-10-CM

## 2020-06-07 NOTE — Progress Notes (Signed)
   Covid-19 Vaccination Clinic  Name:  Anne Becker    MRN: [id number] DOB: [birth year]  06/07/2020  [name] was observed post Covid-19 immunization for 15 minutes without incident. She was provided with Vaccine Information Sheet and instruction to access the V-Safe system.   [name] was instructed to call 911 with any severe reactions post vaccine: Marland Kitchen Difficulty breathing  . Swelling of face and throat  . A fast heartbeat  . A bad rash all over body  . Dizziness and weakness   Immunizations Administered    No immunizations on file.

## 2021-03-28 IMAGING — US US PELVIS COMPLETE WITH TRANSVAGINAL
1 series · 13 of 25 positions shown · non-contrast
Comparison: Abdominal radiograph 04/29/2019

CLINICAL DATA: History of PCOS, infertility for 1 year



[Series 1: us pelvic complete with transvaginal · 91 acquisitions, 13 frames shown]
[im 1/91]
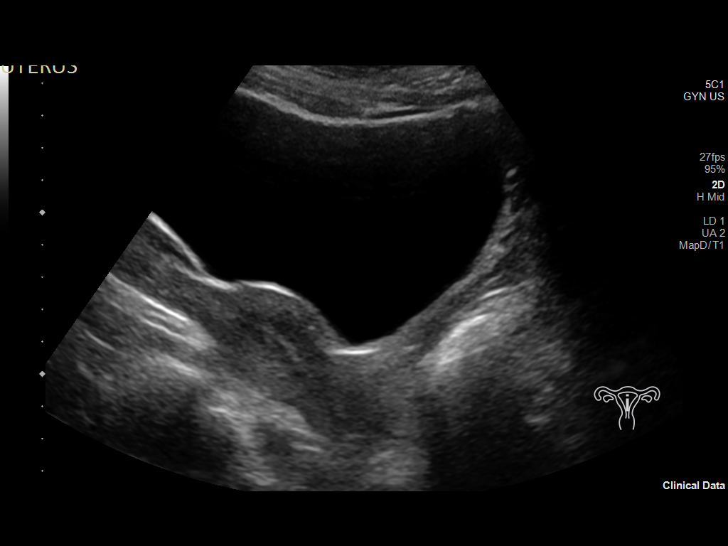
[im 8/91]
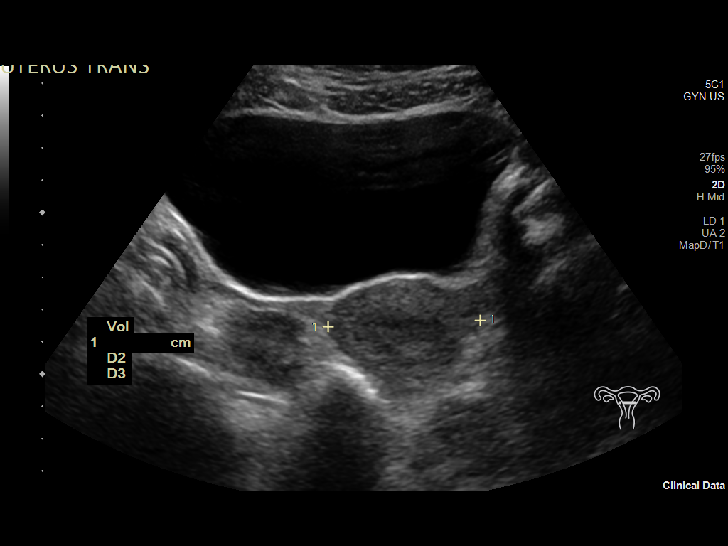
[im 16/91]
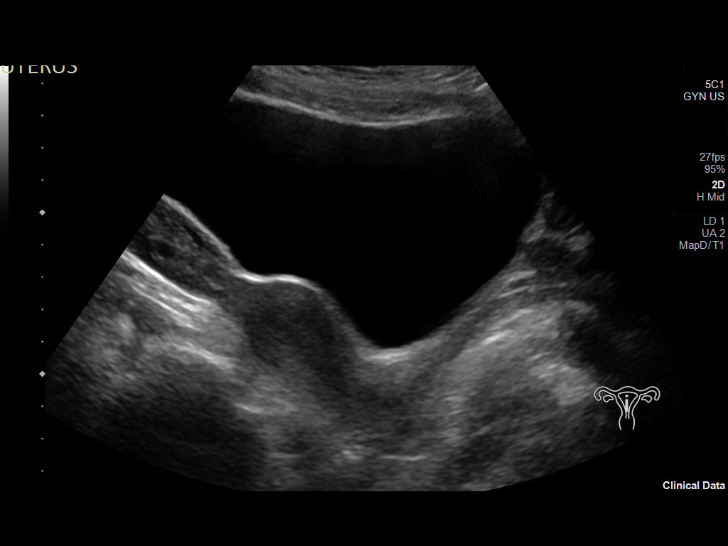
[im 23/91]
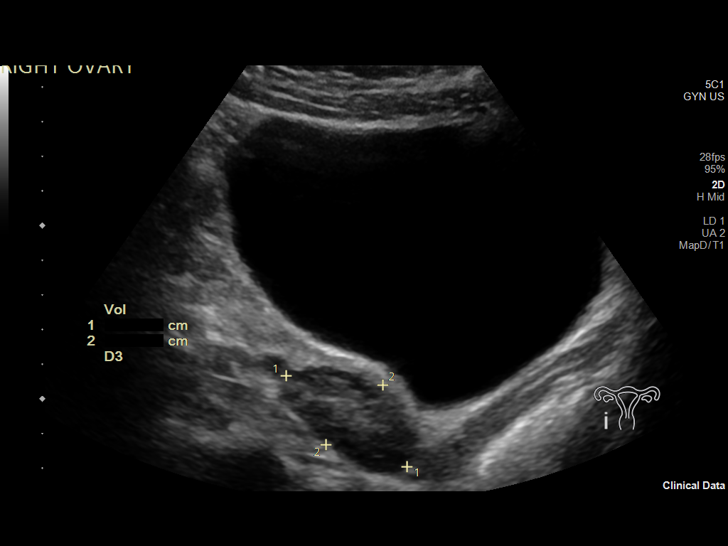
[im 31/91]
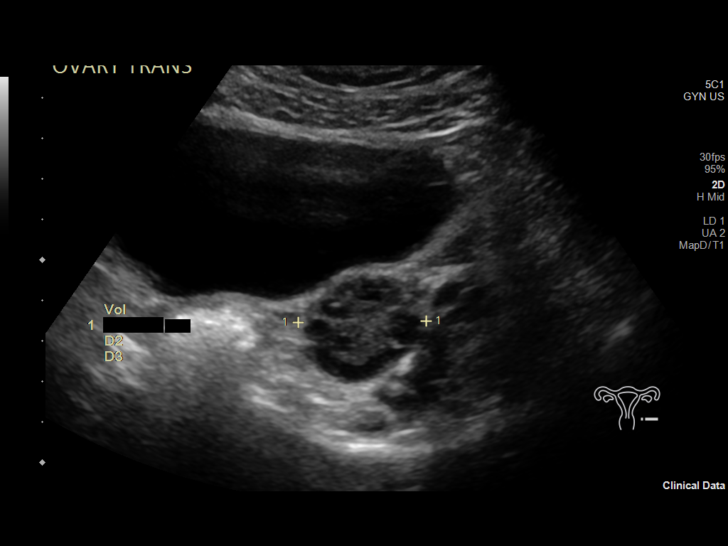
[im 38/91]
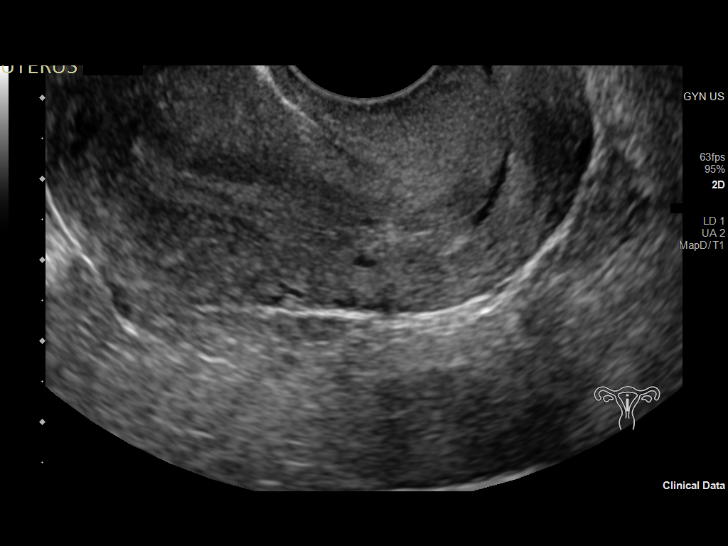
[im 46/91]
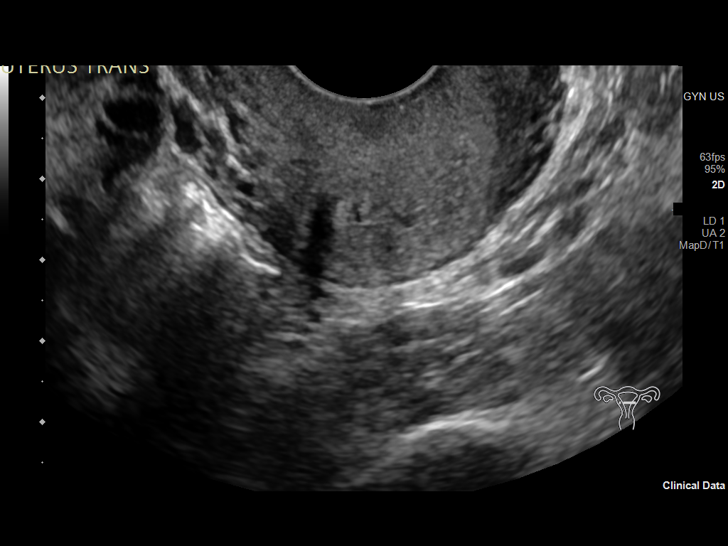
[im 53/91]
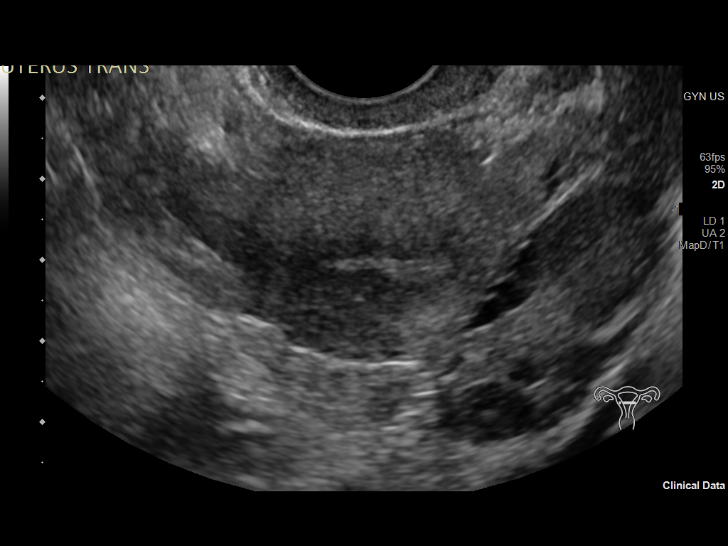
[im 61/91]
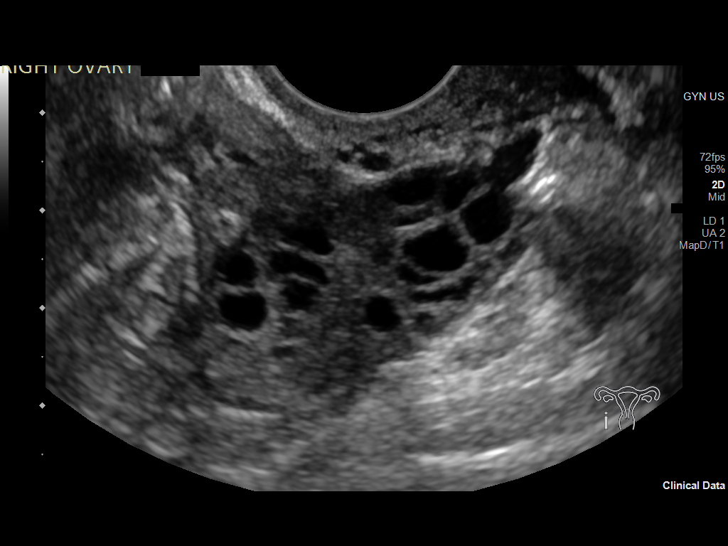
[im 68/91]
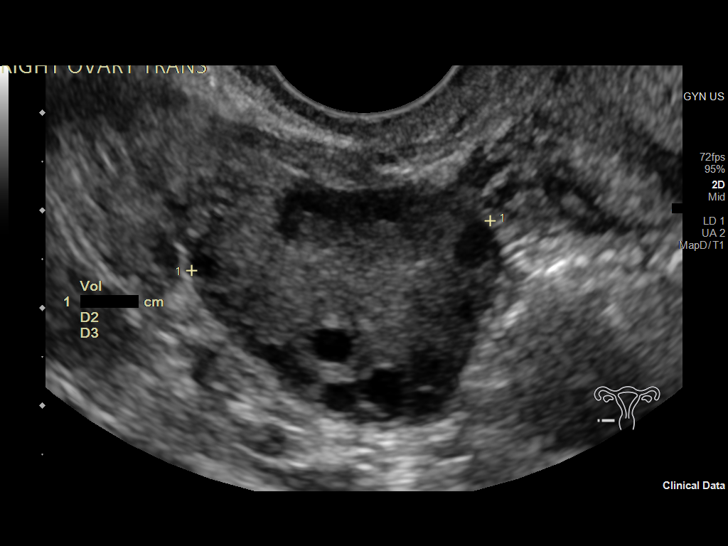
[im 76/91]
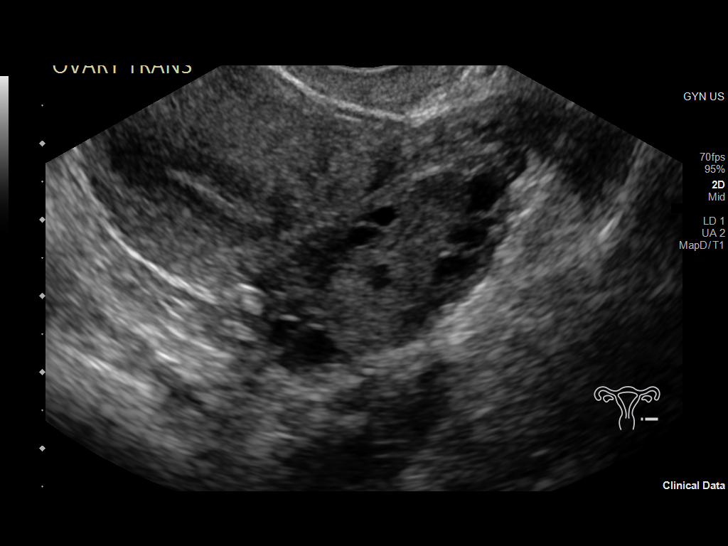
[im 83/91]
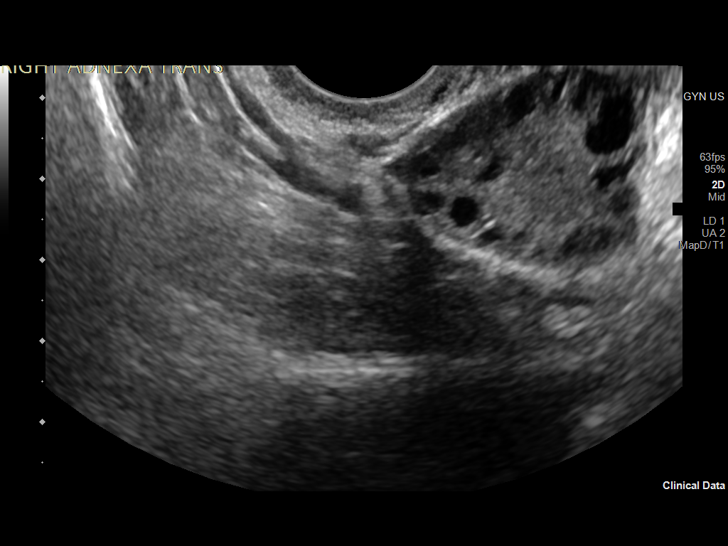
[im 91/91]
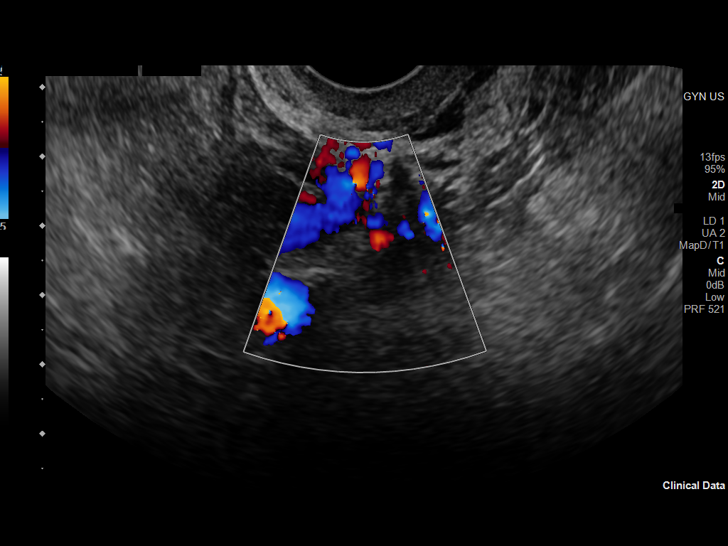

[13 of 25 positions shown; findings below may reference images not displayed]

FINDINGS: Uterus

Measurements: 7.3 x 3.1 x 4.4 cm = volume: 52 mL. No concerning
uterine mass, myometrial abnormality or discernible fibroids. Small
anechoic nabothian cysts noted at the level of the cervix.

Endometrium

Thickness: 7 mm, non thickened.  No focal abnormality visualized.

Right ovary

Measurements: 4.9 x 2.2 x 3.1 cm = volume: 17.7 mL. There are
peripherally marginated anechoic follicles with some slightly
increased echogenicity of the central stroma. Greater than 20
follicles are noted along the ovarian periphery. No concerning
adnexal lesion.

Left ovary

Measurements: 4.1 x 2.7 x 3.1 = volume: 16.6 mL. Peripherally
marginated anechoic follicles with mildly increased echogenicity of
the central ovarian stroma. Greater than 20 follicles are along the
ovarian periphery. No concerning adnexal lesion

Other findings

No abdominopelvic free fluid.
IMPRESSION: Enlarged (> 10 mL) ovaries with hyperechoic central stroma and
numerous peripherally marginated follicles (FNPO>20) compatible with
sonographic criteria of polycystic ovarian syndrome per [facility]
imaging criteria.

Few anechoic nabothian cysts.

Otherwise unremarkable pelvic ultrasound.

## 2021-10-06 LAB — OB RESULTS CONSOLE HEPATITIS B SURFACE ANTIGEN: Hepatitis B Surface Ag: NEGATIVE

## 2021-10-06 LAB — OB RESULTS CONSOLE ABO/RH: RH Type: POSITIVE

## 2021-10-06 LAB — OB RESULTS CONSOLE GC/CHLAMYDIA
Chlamydia: NEGATIVE
Neisseria Gonorrhea: NEGATIVE

## 2021-10-06 LAB — OB RESULTS CONSOLE HIV ANTIBODY (ROUTINE TESTING): HIV: NONREACTIVE

## 2021-10-06 LAB — OB RESULTS CONSOLE RUBELLA ANTIBODY, IGM: Rubella: IMMUNE

## 2021-10-06 LAB — HEPATITIS C ANTIBODY: HCV Ab: NEGATIVE

## 2021-12-15 ENCOUNTER — Other Ambulatory Visit: Payer: Self-pay | Admitting: Obstetrics and Gynecology

## 2021-12-15 DIAGNOSIS — Z363 Encounter for antenatal screening for malformations: Secondary | ICD-10-CM

## 2022-01-02 ENCOUNTER — Encounter: Payer: Self-pay | Admitting: *Deleted

## 2022-01-05 ENCOUNTER — Ambulatory Visit: Payer: 59 | Attending: Obstetrics and Gynecology

## 2022-01-05 ENCOUNTER — Ambulatory Visit: Payer: 59 | Admitting: *Deleted

## 2022-01-05 VITALS — BP 128/76 | HR 89

## 2022-01-05 DIAGNOSIS — Z363 Encounter for antenatal screening for malformations: Secondary | ICD-10-CM | POA: Insufficient documentation

## 2022-01-05 DIAGNOSIS — O99282 Endocrine, nutritional and metabolic diseases complicating pregnancy, second trimester: Secondary | ICD-10-CM

## 2022-01-05 DIAGNOSIS — O35AXX0 Maternal care for other (suspected) fetal abnormality and damage, fetal facial anomalies, not applicable or unspecified: Secondary | ICD-10-CM | POA: Diagnosis not present

## 2022-01-05 DIAGNOSIS — O283 Abnormal ultrasonic finding on antenatal screening of mother: Secondary | ICD-10-CM | POA: Insufficient documentation

## 2022-01-05 DIAGNOSIS — Z3A23 23 weeks gestation of pregnancy: Secondary | ICD-10-CM

## 2022-01-05 DIAGNOSIS — O09812 Supervision of pregnancy resulting from assisted reproductive technology, second trimester: Secondary | ICD-10-CM

## 2022-01-05 DIAGNOSIS — E282 Polycystic ovarian syndrome: Secondary | ICD-10-CM

## 2022-02-06 LAB — OB RESULTS CONSOLE RPR: RPR: NONREACTIVE

## 2022-04-07 LAB — OB RESULTS CONSOLE GBS: GBS: POSITIVE

## 2022-04-21 ENCOUNTER — Telehealth (HOSPITAL_COMMUNITY): Payer: Self-pay | Admitting: *Deleted

## 2022-04-21 NOTE — Telephone Encounter (Unsigned)
Preadmission screen  

## 2022-04-22 ENCOUNTER — Inpatient Hospital Stay (HOSPITAL_COMMUNITY): Payer: 59 | Admitting: Anesthesiology

## 2022-04-22 ENCOUNTER — Inpatient Hospital Stay (HOSPITAL_COMMUNITY)
Admission: AD | Admit: 2022-04-22 | Discharge: 2022-04-26 | DRG: 788 | Disposition: A | Discharge status: Home / Self Care | Payer: 59 | Attending: Obstetrics and Gynecology | Admitting: Obstetrics and Gynecology

## 2022-04-22 ENCOUNTER — Encounter (HOSPITAL_COMMUNITY): Payer: Self-pay | Admitting: Obstetrics and Gynecology

## 2022-04-22 ENCOUNTER — Other Ambulatory Visit: Payer: Self-pay

## 2022-04-22 DIAGNOSIS — Z3A38 38 weeks gestation of pregnancy: Secondary | ICD-10-CM

## 2022-04-22 DIAGNOSIS — O339 Maternal care for disproportion, unspecified: Secondary | ICD-10-CM | POA: Diagnosis not present

## 2022-04-22 DIAGNOSIS — O99824 Streptococcus B carrier state complicating childbirth: Principal | ICD-10-CM | POA: Diagnosis present

## 2022-04-22 DIAGNOSIS — O26893 Other specified pregnancy related conditions, third trimester: Secondary | ICD-10-CM | POA: Diagnosis present

## 2022-04-22 LAB — CBC
HCT: 39.2 % (ref 36.0–46.0)
Hemoglobin: 13.6 g/dL (ref 12.0–15.0)
MCH: 30.1 pg (ref 26.0–34.0)
MCHC: 34.7 g/dL (ref 30.0–36.0)
MCV: 86.7 fL (ref 80.0–100.0)
Platelets: 185 10*3/uL (ref 150–400)
RBC: 4.52 MIL/uL (ref 3.87–5.11)
RDW: 13.8 % (ref 11.5–15.5)
WBC: 7.2 10*3/uL (ref 4.0–10.5)
nRBC: 0 % (ref 0.0–0.2)

## 2022-04-22 LAB — TYPE AND SCREEN
ABO/RH(D): AB POS
Antibody Screen: NEGATIVE

## 2022-04-22 MED ORDER — EPHEDRINE 5 MG/ML INJ: 10.0000 mg | INTRAVENOUS | Status: DC | PRN

## 2022-04-22 MED ORDER — ONDANSETRON HCL 4 MG/2ML IJ SOLN: 4.0000 mg | Freq: Four times a day (QID) | INTRAMUSCULAR | Status: DC | PRN

## 2022-04-22 MED ORDER — LACTATED RINGERS IV SOLN: 500.0000 mL | Freq: Once | INTRAVENOUS | Status: DC

## 2022-04-22 MED ORDER — FENTANYL-BUPIVACAINE-NACL 0.5-0.125-0.9 MG/250ML-% EP SOLN
EPIDURAL | Status: DC | PRN
  Administered 2022-04-22: 12 mL/h via EPIDURAL

## 2022-04-22 MED ORDER — FENTANYL-BUPIVACAINE-NACL 0.5-0.125-0.9 MG/250ML-% EP SOLN
12.0000 mL/h | EPIDURAL | Status: DC | PRN
  Filled 2022-04-22: qty 250

## 2022-04-22 MED ORDER — OXYCODONE-ACETAMINOPHEN 5-325 MG PO TABS: 2.0000 | ORAL_TABLET | ORAL | Status: DC | PRN

## 2022-04-22 MED ORDER — OXYTOCIN BOLUS FROM INFUSION: 333.0000 mL | Freq: Once | INTRAVENOUS | Status: DC

## 2022-04-22 MED ORDER — LACTATED RINGERS IV SOLN: INTRAVENOUS | Status: DC

## 2022-04-22 MED ORDER — OXYTOCIN-SODIUM CHLORIDE 30-0.9 UT/500ML-% IV SOLN
1.0000 m[IU]/min | INTRAVENOUS | Status: DC
  Administered 2022-04-22: 2 m[IU]/min via INTRAVENOUS
  Filled 2022-04-22: qty 500

## 2022-04-22 MED ORDER — OXYTOCIN-SODIUM CHLORIDE 30-0.9 UT/500ML-% IV SOLN: 2.5000 [IU]/h | INTRAVENOUS | Status: DC

## 2022-04-22 MED ORDER — LIDOCAINE HCL (PF) 1 % IJ SOLN
INTRAMUSCULAR | Status: DC | PRN
  Administered 2022-04-22: 5 mL via EPIDURAL

## 2022-04-22 MED ORDER — FLEET ENEMA 7-19 GM/118ML RE ENEM: 1.0000 | ENEMA | RECTAL | Status: DC | PRN

## 2022-04-22 MED ORDER — SODIUM CHLORIDE 0.9 % IV SOLN: 2.0000 g | Freq: Once | INTRAVENOUS | Status: AC

## 2022-04-22 MED ORDER — PHENYLEPHRINE 80 MCG/ML (10ML) SYRINGE FOR IV PUSH (FOR BLOOD PRESSURE SUPPORT): 80.0000 ug | PREFILLED_SYRINGE | INTRAVENOUS | Status: DC | PRN

## 2022-04-22 MED ORDER — SOD CITRATE-CITRIC ACID 500-334 MG/5ML PO SOLN
30.0000 mL | ORAL | Status: DC | PRN
  Filled 2022-04-22: qty 30

## 2022-04-22 MED ORDER — ACETAMINOPHEN 325 MG PO TABS: 650.0000 mg | ORAL_TABLET | ORAL | Status: DC | PRN

## 2022-04-22 MED ORDER — SODIUM CHLORIDE 0.9 % IV SOLN
1.0000 g | INTRAVENOUS | Status: DC
  Administered 2022-04-22 – 2022-04-23 (×3): 1 g via INTRAVENOUS
  Filled 2022-04-22 (×3): qty 1000

## 2022-04-22 MED ORDER — LIDOCAINE HCL (PF) 1 % IJ SOLN: 30.0000 mL | INTRAMUSCULAR | Status: DC | PRN

## 2022-04-22 MED ORDER — TERBUTALINE SULFATE 1 MG/ML IJ SOLN: 0.2500 mg | Freq: Once | INTRAMUSCULAR | Status: DC | PRN

## 2022-04-22 MED ORDER — OXYCODONE-ACETAMINOPHEN 5-325 MG PO TABS: 1.0000 | ORAL_TABLET | ORAL | Status: DC | PRN

## 2022-04-22 MED ORDER — LACTATED RINGERS IV SOLN: 500.0000 mL | INTRAVENOUS | Status: DC | PRN

## 2022-04-22 MED ORDER — DIPHENHYDRAMINE HCL 50 MG/ML IJ SOLN: 12.5000 mg | INTRAMUSCULAR | Status: DC | PRN

## 2022-04-22 NOTE — Anesthesia Preprocedure Evaluation (Addendum)
Anesthesia Evaluation  Patient identified by MRN, date of birth, ID band Patient awake    Reviewed: Allergy & Precautions, NPO status , Patient's Chart, lab work & pertinent test results  Airway Mallampati: II  TM Distance: >3 FB Neck ROM: Full    Dental no notable dental hx. (+) Teeth Intact, Dental Advisory Given   Pulmonary neg pulmonary ROS,    Pulmonary exam normal breath sounds clear to auscultation       Cardiovascular negative cardio ROS Normal cardiovascular exam Rhythm:Regular Rate:Normal     Neuro/Psych negative neurological ROS  negative psych ROS   GI/Hepatic negative GI ROS, Neg liver ROS,   Endo/Other  negative endocrine ROS  Renal/GU negative Renal ROS     Musculoskeletal   Abdominal   Peds  Hematology Lab Results      Component                Value               Date                            HGB                      13.6                04/22/2022                HCT                      39.2                04/22/2022                PLT                      185                 04/22/2022              Anesthesia Other Findings NKA  Reproductive/Obstetrics (+) Pregnancy                            Anesthesia Physical Anesthesia Plan  ASA: 2  Anesthesia Plan: Epidural   Post-op Pain Management:    Induction:   PONV Risk Score and Plan:   Airway Management Planned:   Additional Equipment:   Intra-op Plan:   Post-operative Plan:   Informed Consent: I have reviewed the patients History and Physical, chart, labs and discussed the procedure including the risks, benefits and alternatives for the proposed anesthesia with the patient or authorized representative who has indicated his/her understanding and acceptance.       Plan Discussed with:   Anesthesia Plan Comments: (38.2wk primagravida for LEA)        Anesthesia Quick Evaluation

## 2022-04-22 NOTE — Anesthesia Procedure Notes (Signed)
Epidural Patient location during procedure: OB Start time: 04/22/2022 11:15 PM End time: 04/22/2022 11:34 PM  Staffing Anesthesiologist: Barnet Glasgow, MD Performed: anesthesiologist   Preanesthetic Checklist Completed: patient identified, IV checked, site marked, risks and benefits discussed, surgical consent, monitors and equipment checked, pre-op evaluation and timeout performed  Epidural Patient position: sitting Prep: DuraPrep and site prepped and draped Patient monitoring: continuous pulse ox and blood pressure Approach: midline Location: L3-L4 Injection technique: LOR air  Needle:  Needle type: Tuohy  Needle gauge: 17 G Needle length: 9 cm and 9 Needle insertion depth: 4 cm Catheter type: closed end flexible Catheter size: 19 Gauge Catheter at skin depth: 10 cm Test dose: negative  Assessment Events: blood not aspirated, injection not painful, no injection resistance, no paresthesia and negative IV test  Additional Notes Patient identified. Risks/Benefits/Options discussed with patient including but not limited to bleeding, infection, nerve damage, paralysis, failed block, incomplete pain control, headache, blood pressure changes, nausea, vomiting, reactions to medication both or allergic, itching and postpartum back pain. Confirmed with bedside nurse the patient's most recent platelet count. Confirmed with patient that they are not currently taking any anticoagulation, have any bleeding history or any family history of bleeding disorders. Patient expressed understanding and wished to proceed. All questions were answered. Sterile technique was used throughout the entire procedure. Please see nursing notes for vital signs. Test dose was given through epidural needle and negative prior to continuing to dose epidural or start infusion. Warning signs of high block given to the patient including shortness of breath, tingling/numbness in hands, complete motor block, or any  concerning symptoms with instructions to call for help. Patient was given instructions on fall risk and not to get out of bed. All questions and concerns addressed with instructions to call with any issues.  1 Attempt (S) . Patient tolerated procedure well.

## 2022-04-22 NOTE — Progress Notes (Addendum)
No change in CE, plan to augment at this time with pitocin. Category 1 tracing  BP 132/83 (BP Location: Right Arm)   Pulse 99   Temp 98.2 F (36.8 C) (Oral)   Resp 18   Ht 5\' 3"  (1.6 m)   Wt 76.2 kg   SpO2 100%   BMI 29.74 kg/m

## 2022-04-22 NOTE — Progress Notes (Signed)
Feeling some more pain with contractions now that pitocin was started at approx 2130.  BP (!) 110/59   Pulse 86   Temp 98.2 F (36.8 C) (Oral)   Resp 16   Ht 5\' 3"  (1.6 m)   Wt 76.2 kg   SpO2 100%   BMI 29.74 kg/m  Clear AROM @ 2245, CE 7/90/-1 Pitocin at 63mU/min, TOCO q4-72m  Continue to titrate, desires epidural

## 2022-04-22 NOTE — H&P (Signed)
Anne Becker is a 26 y.o. female presenting for contractions. Was 5cm dilated on Friday in office. Denies VB, LOF, endorses +FM. 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 Social History:  reports that she has never smoked. She has never used smokeless tobacco. She reports that she does not currently use alcohol. She reports that she does not use drugs.     Maternal Diabetes: No 1hr 84 Genetic Screening: Normal Maternal Ultrasounds/Referrals: Absent nasal bone Fetal Ultrasounds or other Referrals:  None Maternal Substance Abuse:  No Significant Maternal Medications:  None Significant Maternal Lab Results:  Group B Strep positive Number of Prenatal Visits:greater than 3 verified prenatal visits Other Comments:  None  Review of Systems  Constitutional:  Negative for chills and fever.  Respiratory:  Negative for shortness of breath.   Cardiovascular:  Negative for chest pain, palpitations and leg swelling.  Gastrointestinal:  Negative for abdominal pain and vomiting.  Neurological:  Negative for dizziness, weakness and headaches.  Psychiatric/Behavioral:  Negative for suicidal ideas.    Maternal Medical History:  Fetal activity: Perceived fetal activity is normal.   Prenatal  complications: No bleeding, PIH or IUGR.   Prenatal Complications - Diabetes: none.   Dilation: 6 Effacement (%): 80 Station: -1, -2 Exam by:: Carlyn Reichert RN Blood pressure 127/83, pulse (!) 104, temperature 98.1 F (36.7 C), temperature source Oral, resp. rate 16, height 5\' 3"  (1.6 m), weight 76.2 kg, SpO2 100 %. Exam Physical Exam Constitutional:      General: She is not in acute distress.    Appearance: She is well-developed.  HENT:     Head: Normocephalic and atraumatic.  Eyes:     Pupils: Pupils are equal, round, and reactive to light.  Cardiovascular:     Rate and Rhythm: Normal rate and regular rhythm.     Heart sounds: No murmur heard.    No gallop.  Abdominal:     Tenderness: There is no abdominal tenderness. There is no guarding or rebound.  Genitourinary:    Vagina: Normal.  Musculoskeletal:        General: Normal range of motion.     Cervical back: Normal range of motion and neck supple.  Skin:    General: Skin is warm and dry.  Neurological:     Mental Status: She is alert and oriented to person, place, and time.     Prenatal labs: ABO, Rh:  AB pos Antibody:  neg Rubella:  imm RPR:   nr HBsAg:  neg HIV:   nr GBS: Positive/-- (10/13 0000)   Cat 1 tracing TOCO q3-m  Assessment/Plan: This is a 26yo G1 @ 67 2/7 by IVF embryo transfer date presenting in active labor. GBS pos, ampicillin ordered. [employer] however will hold off on augmentation at this time in order to get abx coverage on board, plan for amniotomy and PRN pitocin after the fact. Baby boy, anticipate SVD. Desires epidural    Melida Quitter Doug Bucklin 04/22/2022, 7:14 PM

## 2022-04-23 ENCOUNTER — Encounter (HOSPITAL_COMMUNITY)
Admission: AD | Disposition: A | Discharge status: Home / Self Care | Payer: Self-pay | Source: Home / Self Care | Attending: Obstetrics and Gynecology

## 2022-04-23 ENCOUNTER — Other Ambulatory Visit: Payer: Self-pay

## 2022-04-23 ENCOUNTER — Encounter (HOSPITAL_COMMUNITY): Payer: Self-pay | Admitting: Obstetrics and Gynecology

## 2022-04-23 LAB — RPR: RPR Ser Ql: NONREACTIVE

## 2022-04-23 SURGERY — Surgical Case
Anesthesia: Epidural | Site: Abdomen

## 2022-04-23 MED ORDER — OXYCODONE HCL 5 MG PO TABS
5.0000 mg | ORAL_TABLET | ORAL | Status: DC | PRN
  Administered 2022-04-26: 10 mg via ORAL
  Filled 2022-04-23: qty 2

## 2022-04-23 MED ORDER — HYDROMORPHONE HCL 1 MG/ML IJ SOLN: 0.2500 mg | INTRAMUSCULAR | Status: DC | PRN

## 2022-04-23 MED ORDER — PRENATAL MULTIVITAMIN CH
1.0000 | ORAL_TABLET | Freq: Every day | ORAL | Status: DC
  Administered 2022-04-24 – 2022-04-25 (×2): 1 via ORAL
  Filled 2022-04-23 (×3): qty 1

## 2022-04-23 MED ORDER — NALOXONE HCL 4 MG/10ML IJ SOLN: 1.0000 ug/kg/h | INTRAVENOUS | Status: DC | PRN

## 2022-04-23 MED ORDER — OXYTOCIN-SODIUM CHLORIDE 30-0.9 UT/500ML-% IV SOLN
INTRAVENOUS | Status: DC | PRN
  Administered 2022-04-23: 300 mL via INTRAVENOUS

## 2022-04-23 MED ORDER — OXYTOCIN-SODIUM CHLORIDE 30-0.9 UT/500ML-% IV SOLN
2.5000 [IU]/h | INTRAVENOUS | Status: AC
  Administered 2022-04-23: 2.5 [IU]/h via INTRAVENOUS
  Filled 2022-04-23: qty 500

## 2022-04-23 MED ORDER — MEPERIDINE HCL 25 MG/ML IJ SOLN: 6.2500 mg | INTRAMUSCULAR | Status: DC | PRN

## 2022-04-23 MED ORDER — SOD CITRATE-CITRIC ACID 500-334 MG/5ML PO SOLN
30.0000 mL | ORAL | Status: AC
  Administered 2022-04-23: 30 mL via ORAL

## 2022-04-23 MED ORDER — TRANEXAMIC ACID-NACL 1000-0.7 MG/100ML-% IV SOLN
INTRAVENOUS | Status: DC | PRN
  Administered 2022-04-23: 1000 mg via INTRAVENOUS

## 2022-04-23 MED ORDER — FENTANYL CITRATE (PF) 100 MCG/2ML IJ SOLN
INTRAMUSCULAR | Status: AC
  Filled 2022-04-23: qty 2

## 2022-04-23 MED ORDER — IBUPROFEN 600 MG PO TABS
600.0000 mg | ORAL_TABLET | Freq: Four times a day (QID) | ORAL | Status: DC
  Administered 2022-04-24 – 2022-04-25 (×5): 600 mg via ORAL
  Filled 2022-04-23 (×6): qty 1

## 2022-04-23 MED ORDER — PHENYLEPHRINE 80 MCG/ML (10ML) SYRINGE FOR IV PUSH (FOR BLOOD PRESSURE SUPPORT)
PREFILLED_SYRINGE | INTRAVENOUS | Status: DC | PRN
  Administered 2022-04-23 (×2): 160 ug via INTRAVENOUS
  Administered 2022-04-23: 80 ug via INTRAVENOUS

## 2022-04-23 MED ORDER — ONDANSETRON HCL 4 MG/2ML IJ SOLN
INTRAMUSCULAR | Status: AC
  Filled 2022-04-23: qty 2

## 2022-04-23 MED ORDER — TRANEXAMIC ACID-NACL 1000-0.7 MG/100ML-% IV SOLN
INTRAVENOUS | Status: AC
  Filled 2022-04-23: qty 100

## 2022-04-23 MED ORDER — SIMETHICONE 80 MG PO CHEW: 80.0000 mg | CHEWABLE_TABLET | ORAL | Status: DC | PRN

## 2022-04-23 MED ORDER — LIDOCAINE-EPINEPHRINE (PF) 2 %-1:200000 IJ SOLN
INTRAMUSCULAR | Status: AC
  Filled 2022-04-23: qty 20

## 2022-04-23 MED ORDER — DIBUCAINE (PERIANAL) 1 % EX OINT: 1.0000 | TOPICAL_OINTMENT | CUTANEOUS | Status: DC | PRN

## 2022-04-23 MED ORDER — AMISULPRIDE (ANTIEMETIC) 5 MG/2ML IV SOLN: 10.0000 mg | Freq: Once | INTRAVENOUS | Status: DC | PRN

## 2022-04-23 MED ORDER — FENTANYL CITRATE (PF) 100 MCG/2ML IJ SOLN
INTRAMUSCULAR | Status: DC | PRN
  Administered 2022-04-23: 100 ug via EPIDURAL

## 2022-04-23 MED ORDER — ACETAMINOPHEN 500 MG PO TABS
1000.0000 mg | ORAL_TABLET | Freq: Four times a day (QID) | ORAL | Status: DC
  Administered 2022-04-23 – 2022-04-26 (×10): 1000 mg via ORAL
  Filled 2022-04-23 (×11): qty 2

## 2022-04-23 MED ORDER — CEFAZOLIN SODIUM-DEXTROSE 2-4 GM/100ML-% IV SOLN
2.0000 g | INTRAVENOUS | Status: AC
  Administered 2022-04-23: 2 g via INTRAVENOUS

## 2022-04-23 MED ORDER — OXYCODONE HCL 5 MG PO TABS: 5.0000 mg | ORAL_TABLET | Freq: Once | ORAL | Status: DC | PRN

## 2022-04-23 MED ORDER — KETOROLAC TROMETHAMINE 30 MG/ML IJ SOLN
30.0000 mg | Freq: Four times a day (QID) | INTRAMUSCULAR | Status: DC
  Administered 2022-04-23 – 2022-04-24 (×3): 30 mg via INTRAVENOUS
  Filled 2022-04-23 (×3): qty 1

## 2022-04-23 MED ORDER — ACETAMINOPHEN 10 MG/ML IV SOLN
INTRAVENOUS | Status: AC
  Filled 2022-04-23: qty 100

## 2022-04-23 MED ORDER — SODIUM CHLORIDE 0.9 % IV SOLN
INTRAVENOUS | Status: AC
  Filled 2022-04-23: qty 5

## 2022-04-23 MED ORDER — ONDANSETRON HCL 4 MG/2ML IJ SOLN: 4.0000 mg | Freq: Once | INTRAMUSCULAR | Status: DC | PRN

## 2022-04-23 MED ORDER — MENTHOL 3 MG MT LOZG: 1.0000 | LOZENGE | OROMUCOSAL | Status: DC | PRN

## 2022-04-23 MED ORDER — SENNOSIDES-DOCUSATE SODIUM 8.6-50 MG PO TABS
2.0000 | ORAL_TABLET | Freq: Every day | ORAL | Status: DC
  Administered 2022-04-24 – 2022-04-26 (×3): 2 via ORAL
  Filled 2022-04-23 (×3): qty 2

## 2022-04-23 MED ORDER — KETOROLAC TROMETHAMINE 30 MG/ML IJ SOLN: 30.0000 mg | Freq: Once | INTRAMUSCULAR | Status: DC | PRN

## 2022-04-23 MED ORDER — MORPHINE SULFATE (PF) 0.5 MG/ML IJ SOLN
INTRAMUSCULAR | Status: AC
  Filled 2022-04-23: qty 10

## 2022-04-23 MED ORDER — OXYTOCIN-SODIUM CHLORIDE 30-0.9 UT/500ML-% IV SOLN
INTRAVENOUS | Status: AC
  Filled 2022-04-23: qty 500

## 2022-04-23 MED ORDER — ZOLPIDEM TARTRATE 5 MG PO TABS: 5.0000 mg | ORAL_TABLET | Freq: Every evening | ORAL | Status: DC | PRN

## 2022-04-23 MED ORDER — DEXAMETHASONE SODIUM PHOSPHATE 10 MG/ML IJ SOLN
INTRAMUSCULAR | Status: DC | PRN
  Administered 2022-04-23: 10 mg via INTRAVENOUS

## 2022-04-23 MED ORDER — WITCH HAZEL-GLYCERIN EX PADS: 1.0000 | MEDICATED_PAD | CUTANEOUS | Status: DC | PRN

## 2022-04-23 MED ORDER — KETOROLAC TROMETHAMINE 30 MG/ML IJ SOLN: 30.0000 mg | Freq: Four times a day (QID) | INTRAMUSCULAR | Status: AC | PRN

## 2022-04-23 MED ORDER — DEXAMETHASONE SODIUM PHOSPHATE 10 MG/ML IJ SOLN
INTRAMUSCULAR | Status: AC
  Filled 2022-04-23: qty 2

## 2022-04-23 MED ORDER — DEXMEDETOMIDINE HCL IN NACL 80 MCG/20ML IV SOLN
INTRAVENOUS | Status: AC
  Filled 2022-04-23: qty 20

## 2022-04-23 MED ORDER — COCONUT OIL OIL: 1.0000 | TOPICAL_OIL | Status: DC | PRN

## 2022-04-23 MED ORDER — SCOPOLAMINE 1 MG/3DAYS TD PT72
1.0000 | MEDICATED_PATCH | Freq: Once | TRANSDERMAL | Status: AC
  Administered 2022-04-23: 1.5 mg via TRANSDERMAL
  Filled 2022-04-23: qty 1

## 2022-04-23 MED ORDER — SODIUM CHLORIDE 0.9% FLUSH: 3.0000 mL | INTRAVENOUS | Status: DC | PRN

## 2022-04-23 MED ORDER — ACETAMINOPHEN 10 MG/ML IV SOLN
INTRAVENOUS | Status: DC | PRN
  Administered 2022-04-23: 1000 mg via INTRAVENOUS

## 2022-04-23 MED ORDER — SODIUM CHLORIDE 0.9 % IV SOLN
500.0000 mg | INTRAVENOUS | Status: AC
  Administered 2022-04-23: 250 mg via INTRAVENOUS

## 2022-04-23 MED ORDER — DIPHENHYDRAMINE HCL 25 MG PO CAPS: 25.0000 mg | ORAL_CAPSULE | Freq: Four times a day (QID) | ORAL | Status: DC | PRN

## 2022-04-23 MED ORDER — LIDOCAINE-EPINEPHRINE (PF) 2 %-1:200000 IJ SOLN
INTRAMUSCULAR | Status: DC | PRN
  Administered 2022-04-23: 10 mL via EPIDURAL

## 2022-04-23 MED ORDER — ACETAMINOPHEN 500 MG PO TABS: 1000.0000 mg | ORAL_TABLET | Freq: Four times a day (QID) | ORAL | Status: DC

## 2022-04-23 MED ORDER — FENTANYL CITRATE (PF) 100 MCG/2ML IJ SOLN: INTRAMUSCULAR | Status: DC | PRN

## 2022-04-23 MED ORDER — ONDANSETRON HCL 4 MG/2ML IJ SOLN
INTRAMUSCULAR | Status: DC | PRN
  Administered 2022-04-23: 4 mg via INTRAVENOUS

## 2022-04-23 MED ORDER — SIMETHICONE 80 MG PO CHEW
80.0000 mg | CHEWABLE_TABLET | Freq: Three times a day (TID) | ORAL | Status: DC
  Administered 2022-04-23 – 2022-04-26 (×7): 80 mg via ORAL
  Filled 2022-04-23 (×7): qty 1

## 2022-04-23 MED ORDER — MORPHINE SULFATE (PF) 0.5 MG/ML IJ SOLN
INTRAMUSCULAR | Status: DC | PRN
  Administered 2022-04-23: 3 mg via EPIDURAL

## 2022-04-23 MED ORDER — DIPHENHYDRAMINE HCL 25 MG PO CAPS: 25.0000 mg | ORAL_CAPSULE | ORAL | Status: DC | PRN

## 2022-04-23 MED ORDER — ONDANSETRON HCL 4 MG/2ML IJ SOLN: 4.0000 mg | Freq: Three times a day (TID) | INTRAMUSCULAR | Status: DC | PRN

## 2022-04-23 MED ORDER — DEXMEDETOMIDINE HCL IN NACL 80 MCG/20ML IV SOLN
INTRAVENOUS | Status: DC | PRN
  Administered 2022-04-23: 8 ug via BUCCAL

## 2022-04-23 MED ORDER — DIPHENHYDRAMINE HCL 50 MG/ML IJ SOLN: 12.5000 mg | INTRAMUSCULAR | Status: DC | PRN

## 2022-04-23 MED ORDER — TETANUS-DIPHTH-ACELL PERTUSSIS 5-2.5-18.5 LF-MCG/0.5 IM SUSY: 0.5000 mL | PREFILLED_SYRINGE | Freq: Once | INTRAMUSCULAR | Status: DC

## 2022-04-23 MED ORDER — NALOXONE HCL 0.4 MG/ML IJ SOLN: 0.4000 mg | INTRAMUSCULAR | Status: DC | PRN

## 2022-04-23 MED ORDER — CEFAZOLIN SODIUM-DEXTROSE 2-4 GM/100ML-% IV SOLN
INTRAVENOUS | Status: AC
  Filled 2022-04-23: qty 100

## 2022-04-23 MED ORDER — OXYCODONE HCL 5 MG/5ML PO SOLN: 5.0000 mg | Freq: Once | ORAL | Status: DC | PRN

## 2022-04-23 SURGICAL SUPPLY — 35 items
CHLORAPREP W/TINT 26ML (MISCELLANEOUS) ×2 IMPLANT
CLAMP UMBILICAL CORD (MISCELLANEOUS) ×1 IMPLANT
CLOTH BEACON ORANGE TIMEOUT ST (SAFETY) ×1 IMPLANT
DERMABOND ADVANCED .7 DNX12 (GAUZE/BANDAGES/DRESSINGS) ×1 IMPLANT
DRSG OPSITE POSTOP 4X10 (GAUZE/BANDAGES/DRESSINGS) ×1 IMPLANT
ELECT REM PT RETURN 9FT ADLT (ELECTROSURGICAL) ×1
ELECTRODE REM PT RTRN 9FT ADLT (ELECTROSURGICAL) ×1 IMPLANT
EXTRACTOR VACUUM BELL STYLE (SUCTIONS) IMPLANT
GAUZE PAD ABD 7.5X8 STRL (GAUZE/BANDAGES/DRESSINGS) IMPLANT
GAUZE SPONGE 4X4 12PLY STRL LF (GAUZE/BANDAGES/DRESSINGS) IMPLANT
GLOVE BIO SURGEON STRL SZ 6.5 (GLOVE) ×1 IMPLANT
GLOVE BIOGEL PI IND STRL 6.5 (GLOVE) ×1 IMPLANT
GLOVE BIOGEL PI IND STRL 7.0 (GLOVE) ×2 IMPLANT
GOWN STRL REUS W/TWL LRG LVL3 (GOWN DISPOSABLE) ×2 IMPLANT
HEMOSTAT ARISTA ABSORB 3G PWDR (HEMOSTASIS) IMPLANT
KIT ABG SYR 3ML LUER SLIP (SYRINGE) IMPLANT
NDL HYPO 25X5/8 SAFETYGLIDE (NEEDLE) IMPLANT
NEEDLE HYPO 25X5/8 SAFETYGLIDE (NEEDLE) IMPLANT
NS IRRIG 1000ML POUR BTL (IV SOLUTION) ×1 IMPLANT
PACK C SECTION WH (CUSTOM PROCEDURE TRAY) ×1 IMPLANT
PAD ABD 8X10 STRL (GAUZE/BANDAGES/DRESSINGS) IMPLANT
PAD OB MATERNITY 4.3X12.25 (PERSONAL CARE ITEMS) ×1 IMPLANT
RTRCTR C-SECT PINK 25CM LRG (MISCELLANEOUS) IMPLANT
SUT PDS AB 0 CTX 36 PDP370T (SUTURE) IMPLANT
SUT PLAIN 2 0 (SUTURE)
SUT PLAIN ABS 2-0 CT1 27XMFL (SUTURE) IMPLANT
SUT VIC AB 0 CT1 36 (SUTURE) ×2 IMPLANT
SUT VIC AB 2-0 CT1 27 (SUTURE) ×1
SUT VIC AB 2-0 CT1 TAPERPNT 27 (SUTURE) ×1 IMPLANT
SUT VIC AB 3-0 SH 27 (SUTURE)
SUT VIC AB 3-0 SH 27X BRD (SUTURE) IMPLANT
SUT VIC AB 4-0 KS 27 (SUTURE) ×1 IMPLANT
TOWEL OR 17X24 6PK STRL BLUE (TOWEL DISPOSABLE) ×1 IMPLANT
TRAY FOLEY W/BAG SLVR 14FR LF (SET/KITS/TRAYS/PACK) ×1 IMPLANT
WATER STERILE IRR 1000ML POUR (IV SOLUTION) ×1 IMPLANT

## 2022-04-23 NOTE — Lactation Note (Signed)
This note was copied from a baby's chart. Lactation Consultation Note  Patient Name: Anne Becker [id number] Date: 04/23/2022 Reason for consult: Difficult latch;Primapara;Mother's request;Early term 37-38.6wks;Maternal endocrine disorder Age:28 hours Consult from [id number] Worked w/mom on latching. Application of NS. Baby needed stimulation to feed for 5 minutes. Mom has to use #20 NS in order to BF at this time. Noted some BM dampened NS.  Mom encouraged to feed baby 8-12 times/24 hours and with feeding cues.  Crane set up DEBP. Mom pumped collected some colostrum. Gave to baby in spoon. Gave mom information how much to give baby supplementation . Suggested mom use formula amount since baby wasn't BF well. Mom chooses to give formula after giving BM. New parents needs lots of support and education.  Asked RN to give 1st bottle and demonstrated to parents on how to pace feed.  Plan: Mom is to latch baby to breast using #20 NS Then baby is to be given EBM first then formula. Mom is to pump and save that BM for next feeding. Mom is going to call a Dr. On the contact list to get baby's tongue tie corrected for appointment. Then mom is to call for Lactation OP appointment for the same day if possible as when has tongue tie fixed.   Maternal Data Has patient been taught Hand Expression?: Yes Does the patient have breastfeeding experience prior to this delivery?: No  Feeding Mother's Current Feeding Choice: Breast Milk and Formula  LATCH Score Latch: Repeated attempts needed to sustain latch, nipple held in mouth throughout feeding, stimulation needed to elicit sucking reflex.  Audible Swallowing: A few with stimulation  Type of Nipple: Everted at rest and after stimulation (very short shaft)  Comfort (Breast/Nipple): Filling, red/small blisters or bruises, mild/mod discomfort (edema)  Hold (Positioning): Full assist, staff holds infant at breast  LATCH Score: 5   Lactation Tools  Discussed/Used Tools: Pump;Nipple Shields Nipple shield size: 20 Flange Size: 21 Breast pump type: Double-Electric Breast Pump Pump Education: Setup, frequency, and cleaning;Milk Storage Reason for Pumping: tongue tie/short shaft/supplementation Pumping frequency: q3hr Pumped volume: 2 mL  Interventions Interventions: Breast feeding basics reviewed;Assisted with latch;Skin to skin;Breast massage;Hand express;Pre-pump if needed;Breast compression;Adjust position;Support pillows;Position options;Expressed milk;DEBP;Education  Discharge    Consult Status Consult Status: Follow-up Date: 04/24/22 Follow-up type: In-patient    Theodoro Kalata 04/23/2022, 11:43 PM

## 2022-04-23 NOTE — Anesthesia Postprocedure Evaluation (Signed)
Anesthesia Post Note  Patient: Systems developer  Procedure(s) Performed: CESAREAN SECTION (Abdomen)     Patient location during evaluation: PACU Anesthesia Type: Epidural Level of consciousness: awake and alert and oriented Pain management: pain level controlled Vital Signs Assessment: post-procedure vital signs reviewed and stable Respiratory status: spontaneous breathing, nonlabored ventilation and respiratory function stable Cardiovascular status: blood pressure returned to baseline and stable Postop Assessment: no headache, no backache, spinal receding and no apparent nausea or vomiting Anesthetic complications: no   No notable events documented.  Last Vitals:  Vitals:   04/23/22 1045 04/23/22 1100  BP: (!) 86/65 107/76  Pulse: 89 83  Resp: 15 15  Temp:    SpO2: 98% 97%    Last Pain:  Vitals:   04/23/22 1100  TempSrc:   PainSc: 0-No pain   Pain Goal: Patients Stated Pain Goal: 1 (04/22/22 1832)    LLE Sensation: No sensation (absent) (04/23/22 1100)   RLE Sensation: No sensation (absent) (04/23/22 1100)     Epidural/Spinal Function Cutaneous sensation: (P) No Sensation (04/23/22 1100), Patient able to flex knees: (P) No (04/23/22 1100), Patient able to lift hips off bed: (P) No (04/23/22 1100), Back pain beyond tenderness at insertion site: (P) No (04/23/22 1100), Progressively worsening motor and/or sensory loss: (P) No (04/23/22 1100), Bowel and/or bladder incontinence post epidural: (P) No (04/23/22 1100)  Jarome Matin Styles Fambro

## 2022-04-23 NOTE — Op Note (Signed)
C-Section Operative Note  Date: 04/23/22  Preoperative Diagnosis: IUP @ 38 3/7 Postoperative Diagnosis: same as above, cephalopelvic dispraportion Procedure: PLTCS Indication: Arrest of Descent,  Findings: Viable female infant weighing 3380 with APGARS of 9 and 10 at 1 and 5 minutes, respectively. Normal appearing uterus, bilateral fallopian tubes and ovaries. Specimens: placenta to L&D EBL 256 IVF 1300 UOP 225  Patient Course: Patient admitted in labor, augmented briefly, AROM'ed and proceeded to complete. Patient allowed to push however minimal descent over 4hrs. PLTCS called for arrest of descent; OVD deferred due to concern for small outlet  Consent:  R/B/A of cesarean section discussed with patient. Alternative would be vaginal delivery which would mean shorter postpartum stay and decreased risk of bleeding. Risks of section include infection of the uterus, pelvic organs, or skin, inadvertent injury to internal organs, such as bowel or bladder. If there is major injury, extensive surgery may be required. If injury is minor, it may be treated with relative ease. Discussed possibility of excessive blood loss and transfusion. If bleeding cannot be controlled using medical or minor surgical methods, a cesarean hysterectomy may be performed which would mean no future fertility. Patient accepts the possibility of blood transfusion, if necessary. Patient understands and agrees to move forward with section.   Operative Procedure: Patient was taken to the operating room where epidural anesthesia was found to be adequate by Allis clamp test. She was prepped and draped in the normal sterile fashion in the dorsal supine position with a leftward tilt. An appropriate time out was performed. A Pfannenstiel skin incision was then made with the scalpel and carried through to the underlying layer of fascia by sharp dissection and Bovie cautery. The fascia was nicked in the midline and the incision was extended  laterally with Mayo scissors. The superior aspect of the incision was grasped Coker clamps and dissected off the underlying rectus muscles. In a similar fashion the inferior aspect was dissected off the rectus muscles. Rectus muscles were separated in the midline and the peritoneal cavity entered using hemostats and metzenbaum. The peritoneal incision was then extended both superiorly and inferiorly with careful attention to avoid both bowel and bladder. The Alexis self-retaining wound retractor was then placed within the incision and the lower uterine segment exposed. The bladder flap was developed with Metzenbaum scissors and pushed away from the lower uterine segment. The lower uterine segment was then incised in a low transverse fashion and the cavity itself entered bluntly. The incision was extended bluntly. Amniotic sac was ruptured and fluid was noted to be meconium stained in color. The infant's head was then lifted and delivered from the incision without difficulty using the standard movements. The remainder of the infant delivered and the nose and mouth bulb suctioned with the cord clamped and cut as well. The infant was handed off to NICU. The placenta was then spontaneously expressed from the uterus and the uterus cleared of all clots and debris with moist lap sponge. The uterine incision was then repaired in 2 layers the first layer was a running locked layer of 0-vicryl and the second an imbricating layer of the same suture. The tubes and ovaries were inspected and the gutters cleared of all clots and debris. Arista placed in anterior CDS given edema 2.2 pushing. The uterine incision was inspected and found to be hemostatic. All instruments and sponges as well as the Alexis retractor were then removed from the abdomen. The peritoneum was then reapproximated using 2-0 vicryl suture. The fascia  was then closed with 0 Vicryl in a running fashion. Subcutaneous tissue was reapproximated with 3-0 plain in a  running fashion. The skin was closed with a subcuticular stitch of 4-0 Vicryl on a Keith needle and then reinforced with steri strips and a pressure dressing. At the conclusion of the procedure all instruments and sponge counts were correct. Patient was taken to the recovery room in good condition with her baby accompanying her skin to skin.

## 2022-04-23 NOTE — Progress Notes (Signed)
Laying left lateral after epidural, pelvic pain easing. Continued LOF, now somewhat green in color per pt BP (!) 106/59   Pulse 87   Temp 97.9 F (36.6 C) (Oral)   Resp 16   Ht 5\' 3"  (1.6 m)   Wt 76.2 kg   SpO2 100%   BMI 29.74 kg/m  CE ant lip/0 station Cat 1 tracing with early decels Pitocin at 42mU/min, TOCO q2-79m  Light mec noted on exam. Close eye on status, recheck in 1hr. Anticipate SVD

## 2022-04-23 NOTE — Progress Notes (Signed)
Patient has now been pushing for a total of 4 hours with position changes and a break. Minimal descent at this time, caput forming. Reviewed with patient concern for CPD - patient has excellent effort. Patient's husband enquired about operative vaginal delivery. Given 0 station plus lack of decent, concern for possible fetal vertex delivery followed by dystocia. Patient declined vacuum and opts for PLTCS for arrest of descent  R/B/A of cesarean section discussed with patient. Alternative would be vaginal delivery which would mean shorter postpartum stay and decreased risk of bleeding. Risks of section include infection of the uterus, pelvic organs, or skin, inadvertent injury to internal organs, such as bowel or bladder. If there is major injury, extensive surgery may be required. If injury is minor, it may be treated with relative ease. Discussed possibility of excessive blood loss and transfusion. If bleeding cannot be controlled using medical or minor surgical methods, a cesarean hysterectomy may be performed which would mean no future fertility. Patient accepts the possibility of blood transfusion, if necessary. Patient understands and agrees to move forward with section.

## 2022-04-23 NOTE — Lactation Note (Signed)
This note was copied from a baby's chart. Lactation Consultation Note  Patient Name: Boy Adelai Achey [id number] Date: 04/23/2022 Reason for consult: Follow-up assessment;Primapara;Early term 37-38.6wks;Maternal endocrine disorder Age:28 hours Mom holding baby wrapped in 2 blankets. Mom stated the baby has been cold. Mom was trying to latch. Explained baby can't get to breast wrapped in all of those blankets. Explained will put baby STS and cover w/blankets and try to BF. Mom agreed. Baby couldn't latch. LC applied #20 NS. Baby got on and suckled a few times and then slept. When removed NS noted milk in NS. Praised mom. Placed baby on mom's chest and covered w/2 blankets. After baby warms LC will get mom pumping and give baby something to eat. [place] set up DEBP. Attempted to explain to mom about giving baby supplement .after mom pumps will have an idea of what we need to do. Gave mom a list of contacts Dr.'s for tongue tie. Asked mom to call for Lactation when she is ready to pump.  Maternal Data Does the patient have breastfeeding experience prior to this delivery?: No  Feeding    LATCH Score Latch: Too sleepy or reluctant, no latch achieved, no sucking elicited.  Audible Swallowing: None  Type of Nipple: Everted at rest and after stimulation (very short shaft)  Comfort (Breast/Nipple): Filling, red/small blisters or bruises, mild/mod discomfort (edema)  Hold (Positioning): Full assist, staff holds infant at breast  LATCH Score: 3   Lactation Tools Discussed/Used Tools: Pump;Flanges;Nipple [place] Fuel;Shells Nipple shield size: 20 Flange Size: 21 Breast pump type: Double-Electric Breast Pump Pump Education: Setup, frequency, and cleaning;Milk Storage Reason for Pumping: tongue tie Pumping frequency: q3hr  Interventions Interventions: Assisted with latch;Skin to skin;Breast massage;Hand express;Pre-pump if needed;Reverse pressure;Breast compression;Adjust position;Support  pillows;Position options;Shells;DEBP;Education  Discharge    Consult Status Consult Status: Follow-up Date: 04/24/22 Follow-up type: In-patient    Theodoro Kalata 04/23/2022, 8:58 PM

## 2022-04-23 NOTE — Transfer of Care (Signed)
Immediate Anesthesia Transfer of Care Note  Patient: Anne Becker  Procedure(s) Performed: CESAREAN SECTION  Patient Location: PACU  Anesthesia Type:Epidural  Level of Consciousness: awake  Airway & Oxygen Therapy: Patient Spontanous Breathing  Post-op Assessment: Report given to RN  Post vital signs: Reviewed and stable  Last Vitals:  Vitals Value Taken Time  BP 94/64 04/23/22 1018  Temp    Pulse 94 04/23/22 1020  Resp 17 04/23/22 1020  SpO2 94 % 04/23/22 1020  Vitals shown include unvalidated device data.  Last Pain:  Vitals:   04/23/22 0801  TempSrc: Oral  PainSc: 0-No pain      Patients Stated Pain Goal: 1 (97/74/14 2395)  Complications: No notable events documented.

## 2022-04-23 NOTE — Lactation Note (Signed)
This note was copied from a baby's chart. Lactation Consultation Note  Patient Name: Anne Becker Date: 04/23/2022 Reason for consult: Initial assessment;Primapara;Early term 37-38.6wks Age:28 hours  Infant noted to have little to no elevation of tongue with crying; frenulum visible connecting tongue to lower gum. The tip of infant's tongue had a notch on attempted extension of tongue and there was decreased lateralization, especially on the R side. When giving EBM on a gloved finger, the anterior portion of tongue did not stay adhered to finger. Mom was notified of my observation. Mom was worried about the finding; reassurance was given.   Mom is currently by herself in the room. Mom will call me when her husband arrives so we can set her up with a DEBP.   Mom made aware of O/P services, breastfeeding support groups, community resources, and our phone # for post-discharge questions.    Maternal Data Has patient been taught Hand Expression?: Yes Does the patient have breastfeeding experience prior to this delivery?: No  Feeding Mother's Current Feeding Choice: Breast Milk    Interventions Interventions: Education;Hand express  Discharge Pump: Personal (Lansinoh pump at home) [facility] Program: No  Consult Status Consult Status: Follow-up Date: 04/23/22 Follow-up type: In-patient    [facility] 04/23/2022, 4:26 PM

## 2022-04-23 NOTE — Progress Notes (Signed)
Patient pushed for approx 2hrs with minimal descent. Allowed to take break, placed on peanut ball, will attempt restart at pushing. Pitocin at 41mU/min, TOCO q2-3 with rare couplet BP (!) 92/52   Pulse 78   Temp 98.2 F (36.8 C) (Oral)   Resp 16   Ht 5\' 3"  (1.6 m)   Wt 76.2 kg   SpO2 100%   BMI 29.74 kg/m  Low concern for LGA - scan at 36wks at 49th%tile

## 2022-04-24 LAB — CBC
HCT: 30.3 % — ABNORMAL LOW (ref 36.0–46.0)
Hemoglobin: 10.4 g/dL — ABNORMAL LOW (ref 12.0–15.0)
MCH: 30 pg (ref 26.0–34.0)
MCHC: 34.3 g/dL (ref 30.0–36.0)
MCV: 87.3 fL (ref 80.0–100.0)
Platelets: 157 10*3/uL (ref 150–400)
RBC: 3.47 MIL/uL — ABNORMAL LOW (ref 3.87–5.11)
RDW: 13.7 % (ref 11.5–15.5)
WBC: 11.3 10*3/uL — ABNORMAL HIGH (ref 4.0–10.5)
nRBC: 0 % (ref 0.0–0.2)

## 2022-04-24 NOTE — Progress Notes (Signed)
Subjective: Postpartum Day 1: Cesarean Delivery Patient doing well this morning. States pain is controlled. Ambulating. Hasn't voided yet (foley out this AM). Minimal lochia.   Objective: Patient Vitals for the past 24 hrs:  BP Temp Temp src Pulse Resp SpO2  04/24/22 0600 -- -- -- -- 18 --  04/23/22 2130 103/66 98.3 F (36.8 C) Oral 75 18 98 %  04/23/22 1725 115/68 98.5 F (36.9 C) Oral 84 18 98 %  04/23/22 1303 109/61 98.2 F (36.8 C) Oral 75 -- 96 %  04/23/22 1157 (!) 97/54 98.3 F (36.8 C) Oral 76 20 97 %  04/23/22 1130 (!) 83/64 -- -- 99 (!) 22 95 %  04/23/22 1115 115/71 -- -- 90 17 96 %  04/23/22 1100 107/76 -- -- 83 15 97 %  04/23/22 1045 (!) 86/65 -- -- 89 15 98 %  04/23/22 1030 (!) 98/47 -- -- (!) 103 12 97 %  04/23/22 1019 94/64 97.6 F (36.4 C) Axillary 92 15 94 %    Physical Exam:  General: alert, cooperative, and no distress Lochia: appropriate Uterine Fundus: firm Incision: covered with clean/dry dressing DVT Evaluation: No evidence of DVT seen on physical exam.  Recent Labs    04/22/22 1912 04/24/22 0441  HGB 13.6 10.4*  HCT 39.2 30.3*    Assessment/Plan:  Anne Becker G1P1001 POD#1 sp primary cesarean at [redacted]w[redacted]d for arrest of descent 1. PPC: routine pp care 2. Rh pos 3. Desires neonatal circumcision, R/B/A of procedure discussed at length. Pt understands that neonatal circumcision is not considered medically necessary and is elective. The risks include, but are not limited to bleeding, infection, damage to the penis, development of scar tissue, and having to have it redone at a later date. Pt understands theses risks and wishes to proceed   Rowland Lathe 04/24/2022, 9:27 AM

## 2022-04-24 NOTE — Lactation Note (Addendum)
This note was copied from a baby's chart. Lactation Consultation Note  Patient Name: Anne Becker Date: 04/24/2022 Age : 28 hours old  Reason for consult: Follow-up assessment;Maternal endocrine disorder;Difficult latch;Primapara;1st time breastfeeding;Early term 37-38.6wks;Infant weight loss;Other (Comment) (4 % weight loss, S/P anterior tongue- tie release.) Baby awake and hungry at the start of the consult. LC offered to assist and mom receptive. Due to baby being fussy, LC gave baby and appetizer of formula with a yellow nipple and noted to be to fast.  Switch to the purple nipple and baby handled the flow so much better.  Reviewed with mom how to apply the #20 NS and instill formula and after 3 attempts baby latched and fed 20 mins with swallows, when baby released showed mom and dad how to pace feed the baby. 24 ml.  LC showed mom and dad tongue exercises after the feedings when baby was more relaxed.  Baby stressed tongue and tolerated the tongue exercises well.  Mom fell asleep after the baby finished feeding and will have to pump fter the next feeding.  LC plan :  Feed with feeding cues and by 3 hours  Prior to latch, breast massage, hand express, prepump if needed ,  Apply #20 NS , instill EBM or formula in the top with the curved tip syringe.  Latch the baby with firm support and feed 15 -20 mins ( 30 mins max )  And supplement with 30 ml of breast milk or formula pace feeding .  Post pump both breast for 15 mins , save milk for the next feeding.  Switch the baby to the other breast next feeding.   LC showed dad how to burp the baby and how to handle the baby in a save manner.      Maternal Data Has patient been taught Hand Expression?: Yes (several drops after the latch of 20 mins with a #20 NS)  Feeding Mother's Current Feeding Choice: Breast Milk and Formula Nipple Type: Slow - flow  LATCH Score Latch: Repeated attempts needed to sustain latch, nipple held  in mouth throughout feeding, stimulation needed to elicit sucking reflex.  Audible Swallowing: Spontaneous and intermittent  Type of Nipple: Everted at rest and after stimulation (with #20 NS)  Comfort (Breast/Nipple): Soft / non-tender  Hold (Positioning): Assistance needed to correctly position infant at breast and maintain latch.  LATCH Score: 8   Lactation Tools Discussed/Used Tools: Pump;Flanges (mom did not pump after this feeding due to falling asleep after baby fed. LC had reviewed the [facility] [place] plan during the consult , and can pump after the next feeding.) Nipple shield size: 20 (good fit) Flange Size: 21 Breast pump type: Double-Electric Breast Pump Reason for Pumping: due to use of the NS , DL Pumping frequency: LC Encouraged as part of the LC plan Pumped volume: 5 mL (per mom at a pumping)  Interventions Interventions: Breast feeding basics reviewed;Assisted with latch;Skin to skin;Breast massage;Hand express;Reverse pressure;Breast compression;Adjust position;Support pillows;Position options;Expressed milk;DEBP;Education  Discharge    Consult Status Consult Status: Follow-up Date: 04/25/22 Follow-up type: In-patient    [place] 04/24/2022, 3:18 PM

## 2022-04-24 NOTE — Lactation Note (Signed)
This note was copied from a baby's chart. Lactation Consultation Note  Patient Name: Anne Becker [id number] Date: 04/24/2022 Age : 28 hours old  Reason for consult: Follow-up assessment;Primapara;1st time breastfeeding;Early term 37-38.6wks;Infant weight loss (4 % weight loss, S/P anterior tongue- tie release, baby asleep, and per mom baby last fed at 9:30 am. LC recommended and encouraged mom to call with feeding cues. LC reviewed feeding goals for 24 hours 8-12 times with feeding cues and by 3 hours STS.) Maternal Data    Feeding Mother's Current Feeding Choice: Breast Milk and Formula Nipple Type: Slow - flow  Lactation Tools Discussed/Used Breast pump type: Double-Electric Breast Pump  Interventions Interventions: Breast feeding basics reviewed;Education  Discharge    Consult Status Consult Status: Follow-up Date: 04/24/22 Follow-up type: In-patient    [place] 04/24/2022, 11:04 AM

## 2022-04-25 MED ORDER — IBUPROFEN 600 MG PO TABS
600.0000 mg | ORAL_TABLET | Freq: Four times a day (QID) | ORAL | Status: DC
  Administered 2022-04-25 – 2022-04-26 (×4): 600 mg via ORAL
  Filled 2022-04-25 (×3): qty 1

## 2022-04-25 MED ORDER — OXYCODONE HCL 5 MG PO TABS: 5.0000 mg | ORAL_TABLET | ORAL | 0 refills | Status: AC | PRN

## 2022-04-25 NOTE — Discharge Summary (Signed)
Postpartum Discharge Summary  Date of Service updated      Patient Name: Anne Becker DOB: 02/03/1996 MRN: [id number]  Date of admission: 04/22/2022 Delivery date:04/23/2022  Delivering provider: Deliah Boston  Date of discharge: 04/25/2022  Admitting diagnosis: Normal labor [O80, Z37.9] Intrauterine pregnancy: [redacted]w[redacted]d    Secondary diagnosis:  Principal Problem:   Normal labor  Additional problems: none    Discharge diagnosis: Term Pregnancy Delivered                                              Post partum procedures: none Augmentation: Pitocin Complications: None  Hospital course: Onset of Labor With Unplanned C/S   26y.o. yo G1P1001 at 31w3das admitted in [facility] 04/22/2022. Patient had a labor course significant for none. The patient went for cesarean section due to Arrest of Descent. Delivery details as follows: Membrane Rupture Time/Date: 10:43 PM ,04/22/2022   Delivery Method:C-Section, Low Transverse  Details of operation can be found in separate operative note. Patient had a postpartum course complicated bynone.  She is ambulating,tolerating a regular diet, passing flatus, and urinating well.  Patient is discharged home in stable condition 04/25/22.  Newborn Data: Birth date:04/23/2022  Birth time:9:28 AM  Gender:Female  Living status:Living  Apgars:9 ,10  Weight:3380 g   Magnesium Sulfate received: No BMZ received: No Rhophylac:No MMR:No T-DaP:Given prenatally Flu: No Transfusion:No  Physical exam  Vitals:   04/23/22 1725 04/23/22 2130 04/24/22 0600 04/24/22 1935  BP: 115/68 103/66  108/80  Pulse: 84 75  87  Resp: _0 Temp: 98.5 F (36.9 C) 98.3 F (36.8 C)  97.9 F (36.6 C)  TempSrc: Oral Oral  Oral  SpO2: 98% 98%  100%  Weight:      Height:       G Labs: Lab Results  Component Value Date   WBC 11.3 (H) 04/24/2022   HGB 10.4 (L) 04/24/2022   HCT 30.3 (L) 04/24/2022   MCV 87.3 04/24/2022   PLT 157 04/24/2022        No data to display         Edinburgh Score:     No data to display            After visit meds:  Allergies as of 04/25/2022   No Known Allergies      Medication List     STOP taking these medications    metFORMIN 500 MG tablet Commonly known as: GLUCOPHAGE   NON FORMULARY       TAKE these medications    docusate sodium 100 MG capsule Commonly known as: COLACE Take 1 capsule (100 mg total) by mouth every 12 (twelve) hours.   oxyCODONE 5 MG immediate release tablet Commonly known as: Oxy IR/ROXICODONE Take 1-2 tablets (5-10 mg total) by mouth every 4 (four) hours as needed for moderate pain.   prenatal vitamin w/FE, FA 27-1 MG Tabs tablet Take 1 tablet by mouth daily at 12 noon.         Discharge home in stable condition Infant Feeding:  ? Infant Disposition:home with mother Discharge instruction: per After Visit Summary and Postpartum booklet. Activity: Advance as tolerated. Pelvic rest for 6 weeks.  Diet: routine diet Anticipated Birth Control: Unsure Postpartum Appointment:2 weeks Additional Postpartum F/U: Incision check 2 weeks Future Appointments:No future appointments.  Follow up Visit:  [place] Follow up in 2 week(s).   Contact information: [address]  [place] [address] [phone]                     04/25/2022 Daria Pastures, MD

## 2022-04-25 NOTE — Progress Notes (Signed)
  Patient is eating, ambulating, voiding.  Pain control is good.  Vitals:   04/23/22 1725 04/23/22 2130 04/24/22 0600 04/24/22 1935  BP: 115/68 103/66  108/80  Pulse: 84 75  87  Resp: 18 18 18    Temp: 98.5 F (36.9 C) 98.3 F (36.8 C)  97.9 F (36.6 C)  TempSrc: Oral Oral  Oral  SpO2: 98% 98%  100%  Weight:      Height:        lungs:   clear to auscultation cor:    RRR Abdomen:  soft, appropriate tenderness, incisions intact and without erythema or exudate ex:    no cords   Lab Results  Component Value Date   WBC 11.3 (H) 04/24/2022   HGB 10.4 (L) 04/24/2022   HCT 30.3 (L) 04/24/2022   MCV 87.3 04/24/2022   PLT 157 04/24/2022    --/--/AB POS (10/28 1900)/RI  A/P    Post operative day 2.  Routine post op and postpartum care.  Expect d/c routine.  Oxycodone for pain control.

## 2022-04-25 NOTE — Lactation Note (Addendum)
This note was copied from a baby's chart. Lactation Consultation Note  Patient Name: Anne Becker YIRSW'N Date: 04/25/2022 Age : 28 hours old  Reason for consult: Follow-up assessment;Maternal endocrine disorder;Infant weight loss;Difficult latch;1st time breastfeeding;Primapara;Early term 37-38.6wks;Breastfeeding assistance (6 % weight loss, Baby awake, showing feeding cues, and fussy. LC offered assistance and parents receptive. Diaper dry. LC showed mom how to prepump w/ HP , apply the #20 NS , instill formula, assisted to latch the baby in the FB position Lt. Br. baby.) Tongue exercises reviewed with dad  S/P tongue - tie release 10/30.  LC plan the same as yesterday - LC reviewed with both mom and dad.  Feed with feeding cues and by 3 hours  Prior to latch, breast massage, hand express, prepump if needed ,  Apply #20 NS , instill EBM or formula in the top with the curved tip syringe.  Latch the baby with firm support and feed 15 -20 mins ( 30 mins max )  And supplement with 30 ml of breast milk or formula pace feeding .  Post pump both breast for 15 mins , save milk for the next feeding.  Switch the baby to the other breast next feeding.   LC stressed the importance of consistent pumping while dad is pace feeding baby for 15 mins , save the milk.  LC mentioned to mom and dad the [facility] plan will be reevaluated tomorrow by the [facility].  LC offered parents and recommended and LC O/P F/U , both parents receptive.  LC placed a request for LC O/P F/U in the Epic basket for LC O/P to call mom to set up a [place] O/P F/U. S/P frenotomy 10/30  Maternal Data Has patient been taught Hand Expression?: Yes  Feeding Mother's Current Feeding Choice: Breast Milk and Formula Nipple Type: Extra Slow Flow  LATCH Score Latch: Grasps breast easily, tongue down, lips flanged, rhythmical sucking.  Audible Swallowing: Spontaneous and intermittent  Type of Nipple: Everted at rest and after stimulation  Comfort  (Breast/Nipple): Soft / non-tender  Hold (Positioning): Assistance needed to correctly position infant at breast and maintain latch.  LATCH Score: 9   Lactation Tools Discussed/Used Nipple shield size: 20 Flange Size: 21 Breast pump type: Double-Electric Breast Pump;Manual Pump Education: Milk Storage  Interventions Interventions: Breast feeding basics reviewed;Assisted with latch;Skin to skin;Breast massage;Hand express;Pre-pump if needed;Reverse pressure;Breast compression;Adjust position;Support pillows;Position options;Hand pump;DEBP;Education  Discharge Pump: DEBP;Personal  Consult Status Consult Status: Follow-up Date: 04/26/22 Follow-up type: In-patient    [place] 04/25/2022, 2:42 PM

## 2022-04-26 NOTE — Progress Notes (Signed)
Subjective: Postpartum Day 3: Cesarean Delivery Patient reports pain controlled, no nausea or vomiting, tolerating po  Objective: Vital signs in last 24 hours: Temp:  [96.6 F (35.9 C)-98.2 F (36.8 C)] 96.6 F (35.9 C) (11/01 0515) Pulse Rate:  [74-87] 74 (11/01 0515) Resp:  [17-18] 18 (11/01 0515) BP: (103-111)/(59-74) 110/71 (11/01 0515) SpO2:  [99 %] 99 % (10/31 2059)  Physical Exam:  General: alert, cooperative, and appears stated age Lochia: appropriate Uterine Fundus: firm Incision: healing well DVT Evaluation: No evidence of DVT seen on physical exam.  Recent Labs    04/24/22 0441  HGB 10.4*  HCT 30.3*    Assessment/Plan: Status post Cesarean section. Doing well postoperatively.  Discharge home with standard precautions and return to clinic in 4-6 weeks.  Vanessa Kick, MD 04/26/2022, 11:16 AM

## 2022-04-26 NOTE — Lactation Note (Addendum)
This note was copied from a baby's chart. Lactation Consultation Note  Patient Name: Anne Becker [id number] Date: 04/26/2022 Reason for consult: Follow-up assessment;Maternal endocrine disorder Age:28 hours  P1, Family declined interpretration. Mother pumping with DEBP and expressing good volume. Reviewed milk storage and pumping frequency. Suggest calling for LC to view next feeding with NS. Baby had frenectomy yesterday.  Reviewed exercises to perform daily with infant.  Reviewed engorgement care and monitoring voids/stools.  Returned to room to observe feeding.  Mother latched baby with #20NS.  Attempted without NS but baby latched better with NS at this time.  Baby sustained latch with intermittent swallows. Mother recently pumped 20 ml which she will give to baby after breastfeeding session. Discussed OP support and message was sent to OP Lactation.     Feeding Mother's Current Feeding Choice: Breast Milk and Formula   Lactation Tools Discussed/Used Nipple shield size: 20 Breast pump type: Double-Electric Breast Pump Pump Education: Milk Storage;Setup, frequency, and cleaning Reason for Pumping: stimulation and supplementation (nipple shield) Pumping frequency: q 3 hours Pumped volume: 15 mL  Interventions  Education  Discharge Discharge Education: Engorgement and breast care;Warning signs for feeding baby Pump: DEBP;Personal Hildred Alamin)  Consult Status Consult Status: Follow-up Date: 04/27/22 Follow-up type: In-patient    Vivianne Master [facility] 04/26/2022, 8:51 AM

## 2022-05-03 ENCOUNTER — Inpatient Hospital Stay (HOSPITAL_COMMUNITY): Admission: AD | Admit: 2022-05-03 | Payer: 59 | Source: Home / Self Care | Admitting: Obstetrics and Gynecology

## 2022-05-03 ENCOUNTER — Inpatient Hospital Stay (HOSPITAL_COMMUNITY): Payer: 59

## 2022-05-04 ENCOUNTER — Telehealth (HOSPITAL_COMMUNITY): Payer: Self-pay | Admitting: *Deleted

## 2022-05-04 NOTE — Telephone Encounter (Signed)
Mom reports feeling good. Incision healing well per mom. No concerns regarding herself at this time. EPDS=6 (hospital score=2) Mom reports baby is well. Feeding, peeing, and pooping without difficulty. Reviewed safe sleep. Mom has no concerns about baby at present.  Duffy Rhody, RN 05-04-2022 at 10:04am

## 2022-08-15 ENCOUNTER — Encounter (HOSPITAL_COMMUNITY): Payer: Self-pay | Admitting: Obstetrics and Gynecology
